# Patient Record
Sex: Female | Born: 1960 | Race: Black or African American | Hispanic: No | Marital: Single | Smoking: Never smoker
Health system: Southern US, Community
[De-identification: ages and names within clinical notes are randomized; demographics above are authoritative.]

---

## 1998-06-29 ENCOUNTER — Ambulatory Visit (HOSPITAL_COMMUNITY): Admission: RE | Admit: 1998-06-29 | Discharge: 1998-06-29 | Payer: Self-pay | Admitting: General Surgery

## 1999-07-06 ENCOUNTER — Ambulatory Visit (HOSPITAL_COMMUNITY): Admission: RE | Admit: 1999-07-06 | Discharge: 1999-07-06 | Payer: Self-pay | Admitting: General Surgery

## 1999-07-06 ENCOUNTER — Encounter (HOSPITAL_BASED_OUTPATIENT_CLINIC_OR_DEPARTMENT_OTHER): Payer: Self-pay | Admitting: General Surgery

## 2000-05-31 ENCOUNTER — Other Ambulatory Visit: Admission: RE | Admit: 2000-05-31 | Discharge: 2000-05-31 | Payer: Self-pay | Admitting: Obstetrics and Gynecology

## 2000-05-31 ENCOUNTER — Encounter (INDEPENDENT_AMBULATORY_CARE_PROVIDER_SITE_OTHER): Payer: Self-pay

## 2000-06-28 ENCOUNTER — Ambulatory Visit (HOSPITAL_COMMUNITY): Admission: RE | Admit: 2000-06-28 | Discharge: 2000-06-28 | Payer: Self-pay | Admitting: Obstetrics and Gynecology

## 2000-06-28 ENCOUNTER — Encounter (INDEPENDENT_AMBULATORY_CARE_PROVIDER_SITE_OTHER): Payer: Self-pay

## 2000-09-17 ENCOUNTER — Encounter (HOSPITAL_BASED_OUTPATIENT_CLINIC_OR_DEPARTMENT_OTHER): Payer: Self-pay | Admitting: General Surgery

## 2000-09-17 ENCOUNTER — Ambulatory Visit (HOSPITAL_COMMUNITY): Admission: RE | Admit: 2000-09-17 | Discharge: 2000-09-17 | Payer: Self-pay | Admitting: General Surgery

## 2001-01-11 ENCOUNTER — Inpatient Hospital Stay (HOSPITAL_COMMUNITY): Admission: AD | Admit: 2001-01-11 | Discharge: 2001-01-11 | Payer: Self-pay | Admitting: Obstetrics and Gynecology

## 2001-04-17 ENCOUNTER — Other Ambulatory Visit: Admission: RE | Admit: 2001-04-17 | Discharge: 2001-04-17 | Payer: Self-pay | Admitting: Obstetrics and Gynecology

## 2001-10-14 ENCOUNTER — Other Ambulatory Visit: Admission: RE | Admit: 2001-10-14 | Discharge: 2001-10-14 | Payer: Self-pay | Admitting: Obstetrics and Gynecology

## 2001-10-15 ENCOUNTER — Ambulatory Visit (HOSPITAL_COMMUNITY): Admission: RE | Admit: 2001-10-15 | Discharge: 2001-10-15 | Payer: Self-pay | Admitting: Gastroenterology

## 2002-01-06 ENCOUNTER — Other Ambulatory Visit: Admission: RE | Admit: 2002-01-06 | Discharge: 2002-01-06 | Payer: Self-pay | Admitting: Obstetrics and Gynecology

## 2002-02-18 ENCOUNTER — Encounter (INDEPENDENT_AMBULATORY_CARE_PROVIDER_SITE_OTHER): Payer: Self-pay | Admitting: *Deleted

## 2002-02-18 ENCOUNTER — Ambulatory Visit (HOSPITAL_COMMUNITY): Admission: RE | Admit: 2002-02-18 | Discharge: 2002-02-18 | Payer: Self-pay | Admitting: Obstetrics and Gynecology

## 2002-07-01 ENCOUNTER — Encounter (HOSPITAL_BASED_OUTPATIENT_CLINIC_OR_DEPARTMENT_OTHER): Payer: Self-pay | Admitting: General Surgery

## 2002-07-01 ENCOUNTER — Ambulatory Visit (HOSPITAL_COMMUNITY): Admission: RE | Admit: 2002-07-01 | Discharge: 2002-07-01 | Payer: Self-pay | Admitting: General Surgery

## 2002-10-06 ENCOUNTER — Other Ambulatory Visit: Admission: RE | Admit: 2002-10-06 | Discharge: 2002-10-06 | Payer: Self-pay | Admitting: Obstetrics and Gynecology

## 2002-11-16 ENCOUNTER — Ambulatory Visit (HOSPITAL_COMMUNITY): Admission: RE | Admit: 2002-11-16 | Discharge: 2002-11-16 | Payer: Self-pay | Admitting: Obstetrics and Gynecology

## 2003-02-10 ENCOUNTER — Other Ambulatory Visit: Admission: RE | Admit: 2003-02-10 | Discharge: 2003-02-10 | Payer: Self-pay | Admitting: Obstetrics and Gynecology

## 2003-11-03 ENCOUNTER — Ambulatory Visit (HOSPITAL_COMMUNITY): Admission: RE | Admit: 2003-11-03 | Discharge: 2003-11-03 | Payer: Self-pay | Admitting: General Surgery

## 2003-11-18 ENCOUNTER — Other Ambulatory Visit: Admission: RE | Admit: 2003-11-18 | Discharge: 2003-11-18 | Payer: Self-pay | Admitting: Obstetrics and Gynecology

## 2003-12-21 ENCOUNTER — Other Ambulatory Visit: Admission: RE | Admit: 2003-12-21 | Discharge: 2003-12-21 | Payer: Self-pay | Admitting: Obstetrics and Gynecology

## 2005-01-24 ENCOUNTER — Ambulatory Visit (HOSPITAL_COMMUNITY): Admission: RE | Admit: 2005-01-24 | Discharge: 2005-01-24 | Payer: Self-pay | Admitting: General Surgery

## 2005-05-28 ENCOUNTER — Encounter: Admission: RE | Admit: 2005-05-28 | Discharge: 2005-08-26 | Payer: Self-pay | Admitting: Internal Medicine

## 2006-01-29 ENCOUNTER — Ambulatory Visit (HOSPITAL_COMMUNITY): Admission: RE | Admit: 2006-01-29 | Discharge: 2006-01-29 | Payer: Self-pay | Admitting: General Surgery

## 2006-02-26 ENCOUNTER — Emergency Department (HOSPITAL_COMMUNITY): Admission: EM | Admit: 2006-02-26 | Discharge: 2006-02-26 | Payer: Self-pay | Admitting: Family Medicine

## 2006-02-28 ENCOUNTER — Emergency Department (HOSPITAL_COMMUNITY): Admission: EM | Admit: 2006-02-28 | Discharge: 2006-02-28 | Payer: Self-pay | Admitting: Family Medicine

## 2006-03-02 ENCOUNTER — Emergency Department (HOSPITAL_COMMUNITY): Admission: EM | Admit: 2006-03-02 | Discharge: 2006-03-02 | Payer: Self-pay | Admitting: Family Medicine

## 2006-07-16 ENCOUNTER — Emergency Department (HOSPITAL_COMMUNITY): Admission: EM | Admit: 2006-07-16 | Discharge: 2006-07-16 | Payer: Self-pay | Admitting: Family Medicine

## 2006-07-16 ENCOUNTER — Encounter (INDEPENDENT_AMBULATORY_CARE_PROVIDER_SITE_OTHER): Payer: Self-pay | Admitting: *Deleted

## 2006-07-16 ENCOUNTER — Ambulatory Visit (HOSPITAL_COMMUNITY): Admission: RE | Admit: 2006-07-16 | Discharge: 2006-07-16 | Payer: Self-pay | Admitting: Family Medicine

## 2006-08-06 ENCOUNTER — Encounter: Admission: RE | Admit: 2006-08-06 | Discharge: 2006-08-06 | Payer: Self-pay | Admitting: Sports Medicine

## 2007-02-04 ENCOUNTER — Ambulatory Visit (HOSPITAL_COMMUNITY): Admission: RE | Admit: 2007-02-04 | Discharge: 2007-02-04 | Payer: Self-pay | Admitting: General Surgery

## 2007-09-05 ENCOUNTER — Ambulatory Visit: Payer: Self-pay | Admitting: Hematology and Oncology

## 2007-09-09 LAB — CBC & DIFF AND RETIC
BASO%: 0.3 % (ref 0.0–2.0)
Basophils Absolute: 0 10*3/uL (ref 0.0–0.1)
EOS%: 3.3 % (ref 0.0–7.0)
Eosinophils Absolute: 0.1 10*3/uL (ref 0.0–0.5)
HCT: 23.4 % — ABNORMAL LOW (ref 34.8–46.6)
HGB: 7.5 g/dL — ABNORMAL LOW (ref 11.6–15.9)
IRF: 0.44 — ABNORMAL HIGH (ref 0.130–0.330)
LYMPH%: 32.3 % (ref 14.0–48.0)
MCH: 22.9 pg — ABNORMAL LOW (ref 26.0–34.0)
MCHC: 32 g/dL (ref 32.0–36.0)
MCV: 71.6 fL — ABNORMAL LOW (ref 81.0–101.0)
MONO#: 0.3 10*3/uL (ref 0.1–0.9)
MONO%: 7.7 % (ref 0.0–13.0)
NEUT#: 2.4 10*3/uL (ref 1.5–6.5)
NEUT%: 56.4 % (ref 39.6–76.8)
Platelets: 318 10*3/uL (ref 145–400)
RBC: 3.26 10*6/uL — ABNORMAL LOW (ref 3.70–5.32)
RDW: 22.5 % — ABNORMAL HIGH (ref 11.3–14.5)
RETIC #: 67.8 10*3/uL (ref 19.7–115.1)
Retic %: 2.1 % (ref 0.4–2.3)
WBC: 4.3 10*3/uL (ref 3.9–10.0)
lymph#: 1.4 10*3/uL (ref 0.9–3.3)

## 2007-09-09 LAB — URINALYSIS, MICROSCOPIC - CHCC
Bilirubin (Urine): NEGATIVE
Glucose: NEGATIVE g/dL
Ketones: NEGATIVE mg/dL
Leukocyte Esterase: NEGATIVE
Nitrite: NEGATIVE
Protein: 30 mg/dL
Specific Gravity, Urine: 1.025 (ref 1.003–1.035)
pH: 6 (ref 4.6–8.0)

## 2007-09-09 LAB — CHCC SMEAR

## 2007-09-18 LAB — IRON AND TIBC
%SAT: 3 % — ABNORMAL LOW (ref 20–55)
Iron: 11 ug/dL — ABNORMAL LOW (ref 42–145)
TIBC: 402 ug/dL (ref 250–470)
UIBC: 391 ug/dL

## 2007-09-18 LAB — APTT: aPTT: 29 seconds (ref 24–37)

## 2007-09-18 LAB — COMPREHENSIVE METABOLIC PANEL
ALT: 10 U/L (ref 0–35)
AST: 13 U/L (ref 0–37)
Albumin: 3.8 g/dL (ref 3.5–5.2)
Alkaline Phosphatase: 64 U/L (ref 39–117)
BUN: 14 mg/dL (ref 6–23)
CO2: 23 mEq/L (ref 19–32)
Calcium: 8.6 mg/dL (ref 8.4–10.5)
Chloride: 107 mEq/L (ref 96–112)
Creatinine, Ser: 0.84 mg/dL (ref 0.40–1.20)
Glucose, Bld: 101 mg/dL — ABNORMAL HIGH (ref 70–99)
Potassium: 4.1 mEq/L (ref 3.5–5.3)
Sodium: 138 mEq/L (ref 135–145)
Total Bilirubin: 0.3 mg/dL (ref 0.3–1.2)
Total Protein: 7 g/dL (ref 6.0–8.3)

## 2007-09-18 LAB — HEMOGLOBINOPATHY EVALUATION
Hemoglobin Other: 0 % (ref 0.0–0.0)
Hgb A2 Quant: 2.2 % (ref 2.2–3.2)
Hgb A: 97.8 % (ref 96.8–97.8)
Hgb F Quant: 0 % (ref 0.0–2.0)
Hgb S Quant: 0 % (ref 0.0–0.0)

## 2007-09-18 LAB — PROTEIN ELECTROPHORESIS, SERUM
Albumin ELP: 51.2 % — ABNORMAL LOW (ref 55.8–66.1)
Alpha-1-Globulin: 4.1 % (ref 2.9–4.9)
Alpha-2-Globulin: 9 % (ref 7.1–11.8)
Beta 2: 6.1 % (ref 3.2–6.5)
Beta Globulin: 7.8 % — ABNORMAL HIGH (ref 4.7–7.2)
Gamma Globulin: 21.8 % — ABNORMAL HIGH (ref 11.1–18.8)
Total Protein, Serum Electrophoresis: 7 g/dL (ref 6.0–8.3)

## 2007-09-18 LAB — DIRECT ANTIGLOBULIN TEST (NOT AT ARMC)
DAT (Complement): NEGATIVE
DAT IgG: NEGATIVE

## 2007-09-18 LAB — HAPTOGLOBIN: Haptoglobin: 140 mg/dL (ref 16–200)

## 2007-09-18 LAB — VON WILLEBRAND PANEL
Factor-VIII Activity: 229 % — ABNORMAL HIGH (ref 75–150)
Ristocetin-Cofactor: >150 % — ABNORMAL HIGH (ref 50–150)
Von Willebrand Ag: 250 % normal — ABNORMAL HIGH (ref 61–164)

## 2007-09-18 LAB — ERYTHROPOIETIN: Erythropoietin: 262 m[IU]/mL — ABNORMAL HIGH (ref 2.6–34.0)

## 2007-09-18 LAB — PROTHROMBIN TIME
INR: 1 (ref 0.0–1.5)
Prothrombin Time: 13.7 seconds (ref 11.6–15.2)

## 2007-09-18 LAB — VITAMIN B12: Vitamin B-12: 424 pg/mL (ref 211–911)

## 2007-09-18 LAB — FERRITIN: Ferritin: 4 ng/mL — ABNORMAL LOW (ref 10–291)

## 2007-10-09 LAB — IRON AND TIBC
%SAT: 20 % (ref 20–55)
Iron: 64 ug/dL (ref 42–145)
TIBC: 317 ug/dL (ref 250–470)
UIBC: 253 ug/dL

## 2007-10-09 LAB — CBC WITH DIFFERENTIAL/PLATELET
BASO%: 0.9 % (ref 0.0–2.0)
Basophils Absolute: 0 10*3/uL (ref 0.0–0.1)
EOS%: 3.5 % (ref 0.0–7.0)
Eosinophils Absolute: 0.2 10*3/uL (ref 0.0–0.5)
HCT: 33.1 % — ABNORMAL LOW (ref 34.8–46.6)
HGB: 11 g/dL — ABNORMAL LOW (ref 11.6–15.9)
LYMPH%: 22.4 % (ref 14.0–48.0)
MCH: 28 pg (ref 26.0–34.0)
MCHC: 33.2 g/dL (ref 32.0–36.0)
MCV: 84.4 fL (ref 81.0–101.0)
MONO#: 0.2 10*3/uL (ref 0.1–0.9)
MONO%: 4.4 % (ref 0.0–13.0)
NEUT#: 3.4 10*3/uL (ref 1.5–6.5)
NEUT%: 68.8 % (ref 39.6–76.8)
Platelets: 406 10*3/uL — ABNORMAL HIGH (ref 145–400)
RBC: 3.93 10*6/uL (ref 3.70–5.32)
RDW: 29.7 % — ABNORMAL HIGH (ref 11.3–14.5)
WBC: 5 10*3/uL (ref 3.9–10.0)
lymph#: 1.1 10*3/uL (ref 0.9–3.3)

## 2007-10-09 LAB — FERRITIN: Ferritin: 326 ng/mL — ABNORMAL HIGH (ref 10–291)

## 2007-10-22 ENCOUNTER — Encounter: Admission: RE | Admit: 2007-10-22 | Discharge: 2007-10-22 | Payer: Self-pay | Admitting: Obstetrics and Gynecology

## 2007-10-28 ENCOUNTER — Encounter: Admission: RE | Admit: 2007-10-28 | Discharge: 2007-10-28 | Payer: Self-pay | Admitting: Interventional Radiology

## 2007-12-23 ENCOUNTER — Ambulatory Visit: Payer: Self-pay | Admitting: Hematology and Oncology

## 2007-12-25 LAB — IRON AND TIBC
%SAT: 10 % — ABNORMAL LOW (ref 20–55)
Iron: 37 ug/dL — ABNORMAL LOW (ref 42–145)
TIBC: 372 ug/dL (ref 250–470)
UIBC: 335 ug/dL

## 2007-12-25 LAB — CBC WITH DIFFERENTIAL/PLATELET
BASO%: 0.2 % (ref 0.0–2.0)
Basophils Absolute: 0 10*3/uL (ref 0.0–0.1)
EOS%: 0.9 % (ref 0.0–7.0)
Eosinophils Absolute: 0.1 10*3/uL (ref 0.0–0.5)
HCT: 36 % (ref 34.8–46.6)
HGB: 12.1 g/dL (ref 11.6–15.9)
LYMPH%: 17.2 % (ref 14.0–48.0)
MCH: 29.5 pg (ref 26.0–34.0)
MCHC: 33.7 g/dL (ref 32.0–36.0)
MCV: 87.8 fL (ref 81.0–101.0)
MONO#: 0.4 10*3/uL (ref 0.1–0.9)
MONO%: 6.5 % (ref 0.0–13.0)
NEUT#: 4.9 10*3/uL (ref 1.5–6.5)
NEUT%: 75.2 % (ref 39.6–76.8)
Platelets: 369 10*3/uL (ref 145–400)
RBC: 4.1 10*6/uL (ref 3.70–5.32)
RDW: 15.8 % — ABNORMAL HIGH (ref 11.3–14.5)
WBC: 6.5 10*3/uL (ref 3.9–10.0)
lymph#: 1.1 10*3/uL (ref 0.9–3.3)

## 2007-12-25 LAB — FERRITIN: Ferritin: 52 ng/mL (ref 10–291)

## 2007-12-25 LAB — BASIC METABOLIC PANEL
BUN: 13 mg/dL (ref 6–23)
CO2: 22 mEq/L (ref 19–32)
Calcium: 9.5 mg/dL (ref 8.4–10.5)
Chloride: 105 mEq/L (ref 96–112)
Creatinine, Ser: 0.73 mg/dL (ref 0.40–1.20)
Glucose, Bld: 98 mg/dL (ref 70–99)
Potassium: 4.2 mEq/L (ref 3.5–5.3)
Sodium: 137 mEq/L (ref 135–145)

## 2008-01-01 ENCOUNTER — Ambulatory Visit (HOSPITAL_COMMUNITY): Admission: RE | Admit: 2008-01-01 | Discharge: 2008-01-02 | Payer: Self-pay | Admitting: Interventional Radiology

## 2008-01-22 ENCOUNTER — Encounter: Admission: RE | Admit: 2008-01-22 | Discharge: 2008-01-22 | Payer: Self-pay | Admitting: Interventional Radiology

## 2008-02-04 LAB — CBC WITH DIFFERENTIAL/PLATELET
BASO%: 0.3 % (ref 0.0–2.0)
Basophils Absolute: 0 10*3/uL (ref 0.0–0.1)
EOS%: 4.5 % (ref 0.0–7.0)
Eosinophils Absolute: 0.2 10*3/uL (ref 0.0–0.5)
HCT: 35.3 % (ref 34.8–46.6)
HGB: 12.1 g/dL (ref 11.6–15.9)
LYMPH%: 29.3 % (ref 14.0–48.0)
MCH: 29.5 pg (ref 26.0–34.0)
MCHC: 34.2 g/dL (ref 32.0–36.0)
MCV: 86.3 fL (ref 81.0–101.0)
MONO#: 0.3 10*3/uL (ref 0.1–0.9)
MONO%: 6.5 % (ref 0.0–13.0)
NEUT#: 2.4 10*3/uL (ref 1.5–6.5)
NEUT%: 59.4 % (ref 39.6–76.8)
Platelets: 283 10*3/uL (ref 145–400)
RBC: 4.09 10*6/uL (ref 3.70–5.32)
RDW: 15.5 % — ABNORMAL HIGH (ref 11.3–14.5)
WBC: 4.1 10*3/uL (ref 3.9–10.0)
lymph#: 1.2 10*3/uL (ref 0.9–3.3)

## 2008-02-04 LAB — BASIC METABOLIC PANEL
BUN: 15 mg/dL (ref 6–23)
CO2: 24 mEq/L (ref 19–32)
Calcium: 9.2 mg/dL (ref 8.4–10.5)
Chloride: 106 mEq/L (ref 96–112)
Creatinine, Ser: 0.76 mg/dL (ref 0.40–1.20)
Glucose, Bld: 87 mg/dL (ref 70–99)
Potassium: 4.4 mEq/L (ref 3.5–5.3)
Sodium: 139 mEq/L (ref 135–145)

## 2008-02-04 LAB — IRON AND TIBC
%SAT: 12 % — ABNORMAL LOW (ref 20–55)
Iron: 39 ug/dL — ABNORMAL LOW (ref 42–145)
TIBC: 330 ug/dL (ref 250–470)
UIBC: 291 ug/dL

## 2008-02-04 LAB — FERRITIN: Ferritin: 57 ng/mL (ref 10–291)

## 2008-02-25 ENCOUNTER — Ambulatory Visit (HOSPITAL_COMMUNITY): Admission: RE | Admit: 2008-02-25 | Discharge: 2008-02-25 | Payer: Self-pay | Admitting: General Surgery

## 2008-04-29 ENCOUNTER — Ambulatory Visit: Payer: Self-pay | Admitting: Hematology and Oncology

## 2008-05-04 LAB — CBC WITH DIFFERENTIAL/PLATELET
BASO%: 0.4 % (ref 0.0–2.0)
Basophils Absolute: 0 10*3/uL (ref 0.0–0.1)
EOS%: 4.7 % (ref 0.0–7.0)
Eosinophils Absolute: 0.2 10*3/uL (ref 0.0–0.5)
HCT: 38.2 % (ref 34.8–46.6)
HGB: 13.1 g/dL (ref 11.6–15.9)
LYMPH%: 36.9 % (ref 14.0–48.0)
MCH: 29.6 pg (ref 26.0–34.0)
MCHC: 34.3 g/dL (ref 32.0–36.0)
MCV: 86.3 fL (ref 81.0–101.0)
MONO#: 0.2 10*3/uL (ref 0.1–0.9)
MONO%: 5.2 % (ref 0.0–13.0)
NEUT#: 2 10*3/uL (ref 1.5–6.5)
NEUT%: 52.8 % (ref 39.6–76.8)
Platelets: 322 10*3/uL (ref 145–400)
RBC: 4.42 10*6/uL (ref 3.70–5.32)
RDW: 15.3 % — ABNORMAL HIGH (ref 11.3–14.5)
WBC: 3.7 10*3/uL — ABNORMAL LOW (ref 3.9–10.0)
lymph#: 1.4 10*3/uL (ref 0.9–3.3)

## 2008-05-04 LAB — BASIC METABOLIC PANEL
BUN: 18 mg/dL (ref 6–23)
CO2: 22 mEq/L (ref 19–32)
Calcium: 9.3 mg/dL (ref 8.4–10.5)
Chloride: 106 mEq/L (ref 96–112)
Creatinine, Ser: 0.84 mg/dL (ref 0.40–1.20)
Glucose, Bld: 108 mg/dL — ABNORMAL HIGH (ref 70–99)
Potassium: 4.4 mEq/L (ref 3.5–5.3)
Sodium: 140 mEq/L (ref 135–145)

## 2008-05-04 LAB — IRON AND TIBC
%SAT: 23 % (ref 20–55)
Iron: 76 ug/dL (ref 42–145)
TIBC: 326 ug/dL (ref 250–470)
UIBC: 250 ug/dL

## 2008-05-04 LAB — FERRITIN: Ferritin: 79 ng/mL (ref 10–291)

## 2008-07-28 ENCOUNTER — Encounter: Admission: RE | Admit: 2008-07-28 | Discharge: 2008-07-28 | Payer: Self-pay | Admitting: Interventional Radiology

## 2009-03-29 ENCOUNTER — Ambulatory Visit (HOSPITAL_COMMUNITY): Admission: RE | Admit: 2009-03-29 | Discharge: 2009-03-29 | Payer: Self-pay | Admitting: Obstetrics and Gynecology

## 2009-05-12 ENCOUNTER — Emergency Department (HOSPITAL_COMMUNITY): Admission: EM | Admit: 2009-05-12 | Discharge: 2009-05-12 | Payer: Self-pay | Admitting: Family Medicine

## 2009-07-17 ENCOUNTER — Emergency Department (HOSPITAL_COMMUNITY): Admission: EM | Admit: 2009-07-17 | Discharge: 2009-07-17 | Payer: Self-pay | Admitting: Emergency Medicine

## 2009-09-29 ENCOUNTER — Emergency Department (HOSPITAL_COMMUNITY): Admission: EM | Admit: 2009-09-29 | Discharge: 2009-09-29 | Payer: Self-pay | Admitting: Emergency Medicine

## 2010-03-31 ENCOUNTER — Ambulatory Visit (HOSPITAL_COMMUNITY): Admission: RE | Admit: 2010-03-31 | Discharge: 2010-03-31 | Payer: Self-pay | Admitting: Obstetrics and Gynecology

## 2011-03-08 LAB — URINALYSIS, ROUTINE W REFLEX MICROSCOPIC
Bilirubin Urine: NEGATIVE
Glucose, UA: NEGATIVE mg/dL
Hgb urine dipstick: NEGATIVE
Ketones, ur: NEGATIVE mg/dL
Nitrite: NEGATIVE
Protein, ur: NEGATIVE mg/dL
Specific Gravity, Urine: 1.014 (ref 1.005–1.030)
Urobilinogen, UA: 0.2 mg/dL (ref 0.0–1.0)
pH: 6.5 (ref 5.0–8.0)

## 2011-03-08 LAB — PREGNANCY, URINE: Preg Test, Ur: NEGATIVE

## 2011-03-08 LAB — POCT I-STAT, CHEM 8
BUN: 14 mg/dL (ref 6–23)
Calcium, Ion: 1.22 mmol/L (ref 1.12–1.32)
Chloride: 105 mEq/L (ref 96–112)
Creatinine, Ser: 0.9 mg/dL (ref 0.4–1.2)
Glucose, Bld: 89 mg/dL (ref 70–99)
HCT: 41 % (ref 36.0–46.0)
Hemoglobin: 13.9 g/dL (ref 12.0–15.0)
Potassium: 4.2 mEq/L (ref 3.5–5.1)
Sodium: 140 mEq/L (ref 135–145)
TCO2: 26 mmol/L (ref 0–100)

## 2011-03-10 LAB — POCT RAPID STREP A (OFFICE): Streptococcus, Group A Screen (Direct): NEGATIVE

## 2011-04-13 ENCOUNTER — Other Ambulatory Visit (HOSPITAL_COMMUNITY): Payer: Self-pay | Admitting: Obstetrics and Gynecology

## 2011-04-13 DIAGNOSIS — Z1231 Encounter for screening mammogram for malignant neoplasm of breast: Secondary | ICD-10-CM

## 2011-04-20 NOTE — Procedures (Signed)
[place]  Patient:    Lacey Li, Lacey Li Visit Number: [id number] MRN: [id number]          Service Type: END Location: ENDO Attending Physician:  Charna Elizabeth Dictated by:   Anselmo Rod, M.D. Proc. Date: 10/15/01 Admit Date:  10/15/2001   CC:         Sheronette A. Cherly Hensen, M.D.   Procedure Report  DATE OF BIRTH:  01-Jun-2061  PROCEDURE PERFORMED:  Colonoscopy.  ENDOSCOPIST:  Anselmo Rod, M.D.  INSTRUMENT USED:  Olympus video colonoscope (pediatric).  INDICATIONS FOR PROCEDURE:  A 50 year old African-American female with a family history of colon and breast cancer, rule out colonic polyps.  PREPROCEDURE PREPARATION:  Informed consent was obtained from the patient. The patient was fasted for 8 hours prior to the procedure, and prepped with a bottle of magnesium citrate and a gallon of NULYTELY the night prior to the procedure.  PREPROCEDURE PHYSICAL EXAMINATION:  VITAL SIGNS:  Stable.  NECK:  Supple.  CHEST:  Clear to auscultation.  S1 and S2 regular.  ABDOMEN:  Soft with normal bowel sounds.  DESCRIPTION OF PROCEDURE:  The patient was placed in the left lateral decubitus position, and sedated with 60 mg of Demerol and 6 mg of Versed intravenously.  Once the patient was adequately sedated and maintained on low flow oxygen and continuous cardiac monitoring, the Olympus video colonoscope was advanced from the rectum to the cecum and terminal ileum without difficulty.  There was some residual stool in the colon.  Multiple washings were done.  No masses, polyps, erosions, ulcerations, or diverticuli were seen.  IMPRESSION:  Normal appearing colon.  RECOMMENDATIONS:  Repeat colorectal cancer screening is recommended in the next 5 years unless the patient is to develop any abnormal symptoms in the interim. Dictated by:   Anselmo Rod, M.D. Attending Physician:  Charna Elizabeth DD:  10/15/01 TD:  10/15/01 Job:  [id number]

## 2011-04-20 NOTE — Op Note (Signed)
NAME:  Lacey Li, Lacey Li                            ACCOUNT NO.:  [id number]   MEDICAL RECORD NO.:  [id number]                   PATIENT TYPE:  AMB   LOCATION:  SDC                                  FACILITY:  [facility]   PHYSICIAN:  Maxie Better, M.D.            DATE OF BIRTH:  1961-06-16   DATE OF PROCEDURE:  11/16/2002  DATE OF DISCHARGE:                                 OPERATIVE REPORT   PREOPERATIVE DIAGNOSIS:  Desires sterilization.   POSTOPERATIVE DIAGNOSIS:  Desires sterilization.   PROCEDURE:  Laparoscopic tubal ligation with bipolar cautery.   SURGEON:  Maxie Better, M.D.   ANESTHESIA:  General.   INDICATIONS:  This is a 50 year old para 1, single black female, last  menstrual period approximately one week ago who desires permanent  sterilization.  The patient's history is notable for uterine fibroids,  hysteroscopic resection of submucosal fibroids, and conization for severe  cervical dysplasia.  The risks and benefits of the procedure have been  explained to the patient.  Consent was signed.  The patient was transferred  to the operating room.   DESCRIPTION OF PROCEDURE:  Under adequate general anesthesia, the patient  was placed in the dorsal lithotomy position.  She was sterilely prepped and  draped in the usual fashion.  The bladder was catheterized of a moderate  amount of urine.  A bivalve speculum was placed in the vagina.  A single-  tooth tenaculum was placed in the anterior lip of the cervix.  Acorn cannula  was introduced into the cervical os and attached to the tenaculum for  manipulation of the uterus.  The bivalve speculum was removed.  Attention  was then turned to the abdomen where an infraumbilical incision was then  made.  The Veress needle x 2 was attempted with success on the second  attempt where an opening pressure of 9 was noted.  Two liters of carbon  dioxide was insufflated.  The Veress needle was removed.  A disposable 10 mm  trocar was  introduced into the abdomen without incident.  A lighted video  laparoscope was introduced through the port.  A small suprapubic incision  was then made and under direct visualization a 5 mm port was introduced  under direct visualization.  Using the probe, the pelvis was inspected.  The  uterus was noted to be bulky consistent with a prior history of fibroids.  Both ovaries were normal.  Both tubes were normal.  No evidence of  endometriosis was noted in the posterior cul-de-sac.  The upper abdomen was  notable for normal liver edge, small adhesion in the right anterior  abdominal wall which was not lysed.  The probe was replaced by a bipolar  cautery.  The midportion of both fallopian tubes were cauterized for about a  1.5 cm distance with good cauterization noted.  The procedure was then  terminated.  The suprapubic port was removed.  The site inspected, abdomen  deflated, and the infraumbilical port was then removed taking care not to  bring up any underlying structure.  All instruments were subsequently  removed from the vagina at the end of the case.  The incisions were closed  with 4-0 Vicryl with a subcuticular stitch and at the infraumbilical  incision 0 Vicryl was then used to close the deep layer.  Specimen was none.  Estimated blood loss was minimal.  Complications none.  The patient  tolerated the procedure well and was transferred to the recovery room in  stable condition.                                               Maxie Better, M.D.   [place]/MEDQ  D:  11/16/2002  T:  11/16/2002  Job:  [id number]

## 2011-04-23 ENCOUNTER — Ambulatory Visit (HOSPITAL_COMMUNITY)
Admission: RE | Admit: 2011-04-23 | Discharge: 2011-04-23 | Disposition: A | Discharge status: Home / Self Care | Payer: BC Managed Care – PPO | Source: Ambulatory Visit | Attending: Obstetrics and Gynecology | Admitting: Obstetrics and Gynecology

## 2011-04-23 DIAGNOSIS — Z1231 Encounter for screening mammogram for malignant neoplasm of breast: Secondary | ICD-10-CM

## 2011-04-25 ENCOUNTER — Other Ambulatory Visit: Payer: Self-pay | Admitting: Obstetrics and Gynecology

## 2011-04-25 DIAGNOSIS — N6320 Unspecified lump in the left breast, unspecified quadrant: Secondary | ICD-10-CM

## 2011-05-01 ENCOUNTER — Ambulatory Visit
Admission: RE | Admit: 2011-05-01 | Discharge: 2011-05-01 | Disposition: A | Discharge status: Home / Self Care | Payer: BC Managed Care – PPO | Source: Ambulatory Visit | Attending: Obstetrics and Gynecology | Admitting: Obstetrics and Gynecology

## 2011-05-01 DIAGNOSIS — N6320 Unspecified lump in the left breast, unspecified quadrant: Secondary | ICD-10-CM

## 2011-05-24 ENCOUNTER — Inpatient Hospital Stay (INDEPENDENT_AMBULATORY_CARE_PROVIDER_SITE_OTHER)
Admission: RE | Admit: 2011-05-24 | Discharge: 2011-05-24 | Disposition: A | Discharge status: Home / Self Care | Payer: BC Managed Care – PPO | Source: Ambulatory Visit | Attending: Emergency Medicine | Admitting: Emergency Medicine

## 2011-05-24 DIAGNOSIS — J019 Acute sinusitis, unspecified: Secondary | ICD-10-CM

## 2011-06-11 ENCOUNTER — Other Ambulatory Visit: Payer: Self-pay | Admitting: Obstetrics and Gynecology

## 2011-08-23 LAB — CBC
HCT: 33.2 — ABNORMAL LOW
Hemoglobin: 11.2 — ABNORMAL LOW
MCHC: 33.8
MCV: 88.9
Platelets: 326
RBC: 3.73 — ABNORMAL LOW
RDW: 15.5
WBC: 4.4

## 2011-08-23 LAB — CREATININE, SERUM
Creatinine, Ser: 0.6
GFR calc Af Amer: >60
GFR calc non Af Amer: >60

## 2011-08-23 LAB — HCG, SERUM, QUALITATIVE: Preg, Serum: NEGATIVE

## 2012-04-03 ENCOUNTER — Other Ambulatory Visit (HOSPITAL_COMMUNITY): Payer: Self-pay | Admitting: Obstetrics and Gynecology

## 2012-04-03 DIAGNOSIS — Z1231 Encounter for screening mammogram for malignant neoplasm of breast: Secondary | ICD-10-CM

## 2012-05-01 ENCOUNTER — Ambulatory Visit (HOSPITAL_COMMUNITY)
Admission: RE | Admit: 2012-05-01 | Discharge: 2012-05-01 | Disposition: A | Discharge status: Home / Self Care | Payer: BC Managed Care – PPO | Source: Ambulatory Visit | Attending: Obstetrics and Gynecology | Admitting: Obstetrics and Gynecology

## 2012-05-01 DIAGNOSIS — Z1231 Encounter for screening mammogram for malignant neoplasm of breast: Secondary | ICD-10-CM

## 2013-04-06 ENCOUNTER — Other Ambulatory Visit (HOSPITAL_COMMUNITY): Payer: Self-pay | Admitting: Obstetrics and Gynecology

## 2013-04-06 DIAGNOSIS — Z1231 Encounter for screening mammogram for malignant neoplasm of breast: Secondary | ICD-10-CM

## 2013-05-05 ENCOUNTER — Ambulatory Visit (HOSPITAL_COMMUNITY)
Admission: RE | Admit: 2013-05-05 | Discharge: 2013-05-05 | Disposition: A | Discharge status: Home / Self Care | Payer: BC Managed Care – PPO | Source: Ambulatory Visit | Attending: Obstetrics and Gynecology | Admitting: Obstetrics and Gynecology

## 2013-05-05 DIAGNOSIS — Z1231 Encounter for screening mammogram for malignant neoplasm of breast: Secondary | ICD-10-CM

## 2013-09-17 ENCOUNTER — Encounter (HOSPITAL_COMMUNITY): Payer: Self-pay | Admitting: Emergency Medicine

## 2013-09-17 ENCOUNTER — Emergency Department (HOSPITAL_COMMUNITY)
Admission: EM | Admit: 2013-09-17 | Discharge: 2013-09-18 | Disposition: A | Discharge status: Home / Self Care | Payer: BC Managed Care – PPO | Attending: Emergency Medicine | Admitting: Emergency Medicine

## 2013-09-17 DIAGNOSIS — Y929 Unspecified place or not applicable: Secondary | ICD-10-CM | POA: Insufficient documentation

## 2013-09-17 DIAGNOSIS — Y9389 Activity, other specified: Secondary | ICD-10-CM | POA: Insufficient documentation

## 2013-09-17 DIAGNOSIS — F17200 Nicotine dependence, unspecified, uncomplicated: Secondary | ICD-10-CM | POA: Insufficient documentation

## 2013-09-17 DIAGNOSIS — T63461A Toxic effect of venom of wasps, accidental (unintentional), initial encounter: Secondary | ICD-10-CM | POA: Insufficient documentation

## 2013-09-17 DIAGNOSIS — T6391XA Toxic effect of contact with unspecified venomous animal, accidental (unintentional), initial encounter: Secondary | ICD-10-CM | POA: Insufficient documentation

## 2013-09-17 MED ORDER — DIPHENHYDRAMINE HCL 50 MG/ML IJ SOLN
25.0000 mg | Freq: Once | INTRAMUSCULAR | Status: AC
  Administered 2013-09-18: 25 mg via INTRAVENOUS
  Filled 2013-09-17: qty 1

## 2013-09-17 MED ORDER — DEXAMETHASONE SODIUM PHOSPHATE 10 MG/ML IJ SOLN
10.0000 mg | Freq: Once | INTRAMUSCULAR | Status: AC
  Administered 2013-09-18: 10 mg via INTRAMUSCULAR
  Filled 2013-09-17: qty 1

## 2013-09-17 NOTE — ED Notes (Signed)
Bee sting to lt. Ant. Hand. Moderate swelling from wrist to mid forearm.

## 2013-09-18 NOTE — ED Provider Notes (Signed)
CSN: [id number]     Arrival date & time 09/17/13  2244 History   First MD Initiated Contact with Patient 09/17/13 2331     Chief Complaint  Patient presents with  . Insect Bite   (Consider location/radiation/quality/duration/timing/severity/associated sxs/prior Treatment) The history is provided by the patient and medical records.   Patient presenting to the ED following bee sting to her left hand yesterday. States she initially came to the ER but wait was too long so she did not stay. She complains of persistent swelling and warmth to touch of her left hand and distal forearm.  Patient denies any numbness or paresthesias of left upper extremity.  No known allergy to bees.  Patient has taken Benadryl without significant relief.  Denies any chest pain, SOB, sensation of throat closing, or palpitations.  History reviewed. No pertinent past medical history. History reviewed. No pertinent past surgical history. History reviewed. No pertinent family history. History  Substance Use Topics  . Smoking status: Current Every Day Smoker  . Smokeless tobacco: Not on file  . Alcohol Use: No   OB History   Grav Para Term Preterm Abortions TAB SAB Ect Mult Living                 Review of Systems  Skin:       Bee sting  All other systems reviewed and are negative.    Allergies  Review of patient's allergies indicates not on file.  Home Medications  No current outpatient prescriptions on file. BP 104/61  Pulse 78  Temp(Src) 97.8 F (36.6 C) (Oral)  Resp 18  Ht 5' 6.5" (1.689 m)  Wt 227 lb (102.967 kg)  BMI 36.09 kg/m2  SpO2 97%  Physical Exam  Nursing note and vitals reviewed. Constitutional: She is oriented to person, place, and time. She appears well-developed and well-nourished.  HENT:  Head: Normocephalic and atraumatic.  Mouth/Throat: Uvula is midline, oropharynx is clear and moist and mucous membranes are normal. No oropharyngeal exudate, posterior oropharyngeal edema,  posterior oropharyngeal erythema or tonsillar abscesses.  No oropharyngeal edema; airway patent; speaking in full complete sentences without difficulty  Eyes: Conjunctivae and EOM are normal. Pupils are equal, round, and reactive to light.  Neck: Normal range of motion.  Cardiovascular: Normal rate, regular rhythm and normal heart sounds.   Pulmonary/Chest: Effort normal and breath sounds normal.  Musculoskeletal: Normal range of motion.       Right hand: She exhibits swelling. She exhibits normal range of motion, no tenderness, no bony tenderness, normal capillary refill, no deformity and no laceration. Normal sensation noted. Normal strength noted.       Hands: Bee sting to left hand with small welt present; erythema, swelling, and warmth to touch extending from MCP joints of hand to distal forearm; full ROM maintained; strong radial pulse and cap refill; sensation intact; no drainage or signs of infection  Neurological: She is alert and oriented to person, place, and time.  Skin: Skin is warm and dry.  Psychiatric: She has a normal mood and affect.    ED Course  Procedures (including critical care time) Labs Review Labs Reviewed - No data to display Imaging Review No results found.  EKG Interpretation   None       MDM   1. Bee sting reaction, initial encounter    Localized bee reaction without signs of infection or anaphylaxis.  Benadryl and decadron given in the ED.  Instructed to continue taking benadryl at home.  FU  with PCP if problems occur.  Discussed plan with pt, they agreed.  Return precautions advised.  Garlon Hatchet, PA-C 09/18/13 [phone]

## 2013-10-04 NOTE — ED Provider Notes (Signed)
Medical screening examination/treatment/procedure(s) were performed by non-physician practitioner and as supervising physician I was immediately available for consultation/collaboration.  Aldo Sondgeroth M Lashandra Arauz, MD 10/04/13 1845 

## 2014-04-05 ENCOUNTER — Emergency Department (INDEPENDENT_AMBULATORY_CARE_PROVIDER_SITE_OTHER)
Admission: EM | Admit: 2014-04-05 | Discharge: 2014-04-05 | Disposition: A | Discharge status: Home / Self Care | Payer: BC Managed Care – PPO | Source: Home / Self Care | Attending: Emergency Medicine | Admitting: Emergency Medicine

## 2014-04-05 ENCOUNTER — Encounter (HOSPITAL_COMMUNITY): Payer: Self-pay | Admitting: Emergency Medicine

## 2014-04-05 DIAGNOSIS — J039 Acute tonsillitis, unspecified: Secondary | ICD-10-CM

## 2014-04-05 MED ORDER — AMOXICILLIN 500 MG PO CAPS: 500.0000 mg | ORAL_CAPSULE | Freq: Three times a day (TID) | ORAL | Status: DC

## 2014-04-05 MED ORDER — PREDNISONE 20 MG PO TABS
20.0000 mg | ORAL_TABLET | Freq: Two times a day (BID) | ORAL | Status: DC
Start: 2014-04-05 — End: 2020-12-17

## 2014-04-05 NOTE — ED Provider Notes (Signed)
  Chief Complaint   Chief Complaint  Patient presents with  . Sore Throat    History of Present Illness   Lacey Li is a 53 year old female who has had a three-day history of severe sore throat, pain with swallowing, chills, headache, nasal congestion, rhinorrhea, swollen glands, and watery eyes. She denies any recent strep exposure. She's had no fever, earache, difficulty breathing, coughing, wheezing, or GI symptoms. No prior history of strep.   Review of Systems   Other than as noted above, the patient denies any of the following symptoms. Systemic:  No fever, chills, sweats, myalgias, or headache. Eye:  No redness, pain or drainage. ENT:  No earache, nasal congestion, sneezing, rhinorrhea, sinus pressure, sinus pain, or post nasal drip. Lungs:  No cough, sputum production, wheezing, shortness of breath, or chest pain. GI:  No abdominal pain, nausea, vomiting, or diarrhea. Skin:  No rash.  PMFSH   Past medical history, family history, social history, meds, and allergies were reviewed.   Physical Exam     Vital signs:  BP 104/55  Pulse 78  Temp(Src) 98.2 F (36.8 C) (Oral)  Resp 18  SpO2 100% General:  Alert, in no distress. Phonation was normal, no drooling, and patient was able to handle secretions well.  Eye:  No conjunctival injection or drainage. Lids were normal. ENT:  TMs and canals were normal, without erythema or inflammation.  Nasal mucosa was clear and uncongested, without drainage.  Mucous membranes were moist.  Exam of pharynx reveals tonsils to be enlarged and red with spots of white exudate.  There were no oral ulcerations or lesions. There was no bulging of the tonsillar pillars, and the uvula was midline. Neck:  Supple, no adenopathy, tenderness or mass. Lungs:  No respiratory distress.  Lungs were clear to auscultation, without wheezes, rales or rhonchi.  Breath sounds were clear and equal bilaterally.  Heart:  Regular rhythm, without gallops, murmers or  rubs. Skin:  Clear, warm, and dry, without rash or lesions.  Assessment   The encounter diagnosis was Tonsillitis.  There is no evidence of a peritonsillar abscess.    Plan     1.  Meds:  The following meds were prescribed:   Discharge Medication List as of 04/05/2014  1:32 PM    START taking these medications   Details  amoxicillin (AMOXIL) 500 MG capsule Take 1 capsule (500 mg total) by mouth 3 (three) times daily., Starting 04/05/2014, Until Discontinued, Normal    predniSONE (DELTASONE) 20 MG tablet Take 1 tablet (20 mg total) by mouth 2 (two) times daily., Starting 04/05/2014, Until Discontinued, Normal        2.  Patient Education/Counseling:  The patient was given appropriate handouts, self care instructions, and instructed in symptomatic relief, including hot saline gargles, throat lozenges, infectious precautions, and need to trade out toothbrush.    3.  Follow up:  The patient was told to follow up here if no better in 3 to 4 days, or sooner if becoming worse in any way, and given some red flag symptoms such as difficulty swallowing or breathing which would prompt immediate return.      Reuben Likesavid C Dontae Minerva, MD 04/05/14 [phone]

## 2014-04-05 NOTE — Discharge Instructions (Signed)
Pharyngitis °Pharyngitis is redness, pain, and swelling (inflammation) of your pharynx.  °CAUSES  °Pharyngitis is usually caused by infection. Most of the time, these infections are from viruses (viral) and are part of a cold. However, sometimes pharyngitis is caused by bacteria (bacterial). Pharyngitis can also be caused by allergies. Viral pharyngitis may be spread from person to person by coughing, sneezing, and personal items or utensils (cups, forks, spoons, toothbrushes). Bacterial pharyngitis may be spread from person to person by more intimate contact, such as kissing.  °SIGNS AND SYMPTOMS  °Symptoms of pharyngitis include:   °· Sore throat.   °· Tiredness (fatigue).   °· Low-grade fever.   °· Headache. °· Joint pain and muscle aches. °· Skin rashes. °· Swollen lymph nodes. °· Plaque-like film on throat or tonsils (often seen with bacterial pharyngitis). °DIAGNOSIS  °Your health care provider will ask you questions about your illness and your symptoms. Your medical history, along with a physical exam, is often all that is needed to diagnose pharyngitis. Sometimes, a rapid strep test is done. Other lab tests may also be done, depending on the suspected cause.  °TREATMENT  °Viral pharyngitis will usually get better in 3 4 days without the use of medicine. Bacterial pharyngitis is treated with medicines that kill germs (antibiotics).  °HOME CARE INSTRUCTIONS  °· Drink enough water and fluids to keep your urine clear or pale yellow.   °· Only take over-the-counter or prescription medicines as directed by your health care provider:   °· If you are prescribed antibiotics, make sure you finish them even if you start to feel better.   °· Do not take aspirin.   °· Get lots of rest.   °· Gargle with 8 oz of salt water (½ tsp of salt per 1 qt of water) as often as every 1 2 hours to soothe your throat.   °· Throat lozenges (if you are not at risk for choking) or sprays may be used to soothe your throat. °SEEK MEDICAL  CARE IF:  °· You have large, tender lumps in your neck. °· You have a rash. °· You cough up green, yellow-brown, or bloody spit. °SEEK IMMEDIATE MEDICAL CARE IF:  °· Your neck becomes stiff. °· You drool or are unable to swallow liquids. °· You vomit or are unable to keep medicines or liquids down. °· You have severe pain that does not go away with the use of recommended medicines. °· You have trouble breathing (not caused by a stuffy nose). °MAKE SURE YOU:  °· Understand these instructions. °· Will watch your condition. °· Will get help right away if you are not doing well or get worse. °Document Released: 11/19/2005 Document Revised: 09/09/2013 Document Reviewed: 07/27/2013 °ExitCare® Patient Information ©2014 [employer]. ° °

## 2014-04-05 NOTE — ED Notes (Signed)
C/o  Sore throat since Saturday.  Pain with swallowing.  Neck soreness.  Post nasal drip.   Mild relief with salt water gargles.

## 2014-04-06 LAB — CULTURE, GROUP A STREP

## 2014-04-06 NOTE — ED Notes (Signed)
Throat culture: Strep beta hemolytic not group A.  Pt. adequately treated with Amoxicillin.  Needs notified. Lacey Li 04/06/2014

## 2014-04-06 NOTE — Progress Notes (Signed)
Quick Note:  Results are abnormal as noted, but have been adequately treated. No further action necessary. ______ 

## 2014-04-08 ENCOUNTER — Telehealth (HOSPITAL_COMMUNITY): Payer: Self-pay | Admitting: *Deleted

## 2014-04-08 NOTE — ED Notes (Signed)
I called pt. Pt. verified x 2 and given result.  Pt. told she was adequately treated with Amoxicillin.  If anyone she exposed gets the same symptoms, should get checked for strep and if not better after the medication to get rechecked.  Lacey Li 04/08/2014

## 2014-04-23 ENCOUNTER — Emergency Department (INDEPENDENT_AMBULATORY_CARE_PROVIDER_SITE_OTHER)
Admission: EM | Admit: 2014-04-23 | Discharge: 2014-04-23 | Disposition: A | Discharge status: Home / Self Care | Payer: BC Managed Care – PPO | Source: Home / Self Care

## 2014-04-23 ENCOUNTER — Encounter (HOSPITAL_COMMUNITY): Payer: Self-pay | Admitting: Emergency Medicine

## 2014-04-23 DIAGNOSIS — R0982 Postnasal drip: Secondary | ICD-10-CM

## 2014-04-23 DIAGNOSIS — J029 Acute pharyngitis, unspecified: Secondary | ICD-10-CM

## 2014-04-23 DIAGNOSIS — J309 Allergic rhinitis, unspecified: Secondary | ICD-10-CM

## 2014-04-23 LAB — POCT RAPID STREP A: Streptococcus, Group A Screen (Direct): NEGATIVE

## 2014-04-23 NOTE — ED Notes (Signed)
Pt c/o sore throat onset 3 days Reports she was seen here on 04/05/14; treated for strep Took all antibiotics; tolerated well Sx today include dry cough, odynophagia Denies f/v/n/d Alert w/no signs of acute distress.

## 2014-04-23 NOTE — Discharge Instructions (Signed)
Allergic Rhinitis Allegra 180 mg a day May supplement with antihistamine Chlor-Trimeton 2-4 mg every 6 hours. Flonase nasal spray Lots of saline nasal spray frequently Lots of fluids Allergic rhinitis is when the mucous membranes in the nose respond to allergens. Allergens are particles in the air that cause your body to have an allergic reaction. This causes you to release allergic antibodies. Through a chain of events, these eventually cause you to release histamine into the blood stream. Although meant to protect the body, it is this release of histamine that causes your discomfort, such as frequent sneezing, congestion, and an itchy, runny nose.  CAUSES  Seasonal allergic rhinitis (hay fever) is caused by pollen allergens that may come from grasses, trees, and weeds. Year-round allergic rhinitis (perennial allergic rhinitis) is caused by allergens such as house dust mites, pet dander, and mold spores.  SYMPTOMS   Nasal stuffiness (congestion).  Itchy, runny nose with sneezing and tearing of the eyes. DIAGNOSIS  Your health care provider can help you determine the allergen or allergens that trigger your symptoms. If you and your health care provider are unable to determine the allergen, skin or blood testing may be used. TREATMENT  Allergic Rhinitis does not have a cure, but it can be controlled by:  Medicines and allergy shots (immunotherapy).  Avoiding the allergen. Hay fever may often be treated with antihistamines in pill or nasal spray forms. Antihistamines block the effects of histamine. There are over-the-counter medicines that may help with nasal congestion and swelling around the eyes. Check with your health care provider before taking or giving this medicine.  If avoiding the allergen or the medicine prescribed do not work, there are many new medicines your health care provider can prescribe. Stronger medicine may be used if initial measures are ineffective. Desensitizing  injections can be used if medicine and avoidance does not work. Desensitization is when a patient is given ongoing shots until the body becomes less sensitive to the allergen. Make sure you follow up with your health care provider if problems continue. HOME CARE INSTRUCTIONS It is not possible to completely avoid allergens, but you can reduce your symptoms by taking steps to limit your exposure to them. It helps to know exactly what you are allergic to so that you can avoid your specific triggers. SEEK MEDICAL CARE IF:   You have a fever.  You develop a cough that does not stop easily (persistent).  You have shortness of breath.  You start wheezing.  Symptoms interfere with normal daily activities. Document Released: 08/14/2001 Document Revised: 09/09/2013 Document Reviewed: 07/27/2013 [facility] InExitCare Patient Information 2014 [place], [place].  Sore Throat A sore throat is pain, burning, irritation, or scratchiness of the throat. There is often pain or tenderness when swallowing or talking. A sore throat may be accompanied by other symptoms, such as coughing, sneezing, fever, and swollen neck glands. A sore throat is often the first sign of another sickness, such as a cold, flu, strep throat, or mononucleosis (commonly known as mono). Most sore throats go away without medical treatment. CAUSES  The most common causes of a sore throat include:  A viral infection, such as a cold, flu, or mono.  A bacterial infection, such as strep throat, tonsillitis, or whooping cough.  Seasonal allergies.  Dryness in the air.  Irritants, such as smoke or pollution.  Gastroesophageal reflux disease (GERD). HOME CARE INSTRUCTIONS   Only take over-the-counter medicines as directed by your caregiver.  Drink enough fluids to keep your urine  clear or pale yellow.  Rest as needed.  Try using throat sprays, lozenges, or sucking on hard candy to ease any pain (if older than 4 years or as directed).  Sip  warm liquids, such as broth, herbal tea, or warm water with honey to relieve pain temporarily. You may also eat or drink cold or frozen liquids such as frozen ice pops.  Gargle with salt water (mix 1 tsp salt with 8 oz of water).  Do not smoke and avoid secondhand smoke.  Put a cool-mist humidifier in your bedroom at night to moisten the air. You can also turn on a hot shower and sit in the bathroom with the door closed for 5 10 minutes. SEEK IMMEDIATE MEDICAL CARE IF:  You have difficulty breathing.  You are unable to swallow fluids, soft foods, or your saliva.  You have increased swelling in the throat.  Your sore throat does not get better in 7 days.  You have nausea and vomiting.  You have a fever or persistent symptoms for more than 2 3 days.  You have a fever and your symptoms suddenly get worse. MAKE SURE YOU:   Understand these instructions.  Will watch your condition.  Will get help right away if you are not doing well or get worse. Document Released: 12/27/2004 Document Revised: 11/05/2012 Document Reviewed: 07/27/2012 [facility] Patient Information 2014 [place], [place].  Pharyngitis Pharyngitis is a sore throat (pharynx). There is redness, pain, and swelling of your throat. HOME CARE   Drink enough fluids to keep your pee (urine) clear or pale yellow.  Only take medicine as told by your doctor.  You may get sick again if you do not take medicine as told. Finish your medicines, even if you start to feel better.  Do not take aspirin.  Rest.  Rinse your mouth (gargle) with salt water ( tsp of salt per 1 qt of water) every 1 2 hours. This will help the pain.  If you are not at risk for choking, you can suck on hard candy or sore throat lozenges. GET HELP IF:  You have large, tender lumps on your neck.  You have a rash.  You cough up green, yellow-brown, or bloody spit. GET HELP RIGHT AWAY IF:   You have a stiff neck.  You drool or cannot swallow  liquids.  You throw up (vomit) or are not able to keep medicine or liquids down.  You have very bad pain that does not go away with medicine.  You have problems breathing (not from a stuffy nose). MAKE SURE YOU:   Understand these instructions.  Will watch your condition.  Will get help right away if you are not doing well or get worse. Document Released: 05/07/2008 Document Revised: 09/09/2013 Document Reviewed: 07/27/2013 [facility] Patient Information 2014 [place], [place].

## 2014-04-23 NOTE — ED Provider Notes (Signed)
CSN: [id number]     Arrival date & time 04/23/14  1047 History   First MD Initiated Contact with Patient 04/23/14 1125     Chief Complaint  Patient presents with  . Sore Throat   (Consider location/radiation/quality/duration/timing/severity/associated sxs/prior Treatment) HPI Comments: 53 year old female complaining of a three-day history of sore throat, clearing of the throat frequently, PND, cough particularly upon awakening in the morning. She was seen approximately 3 weeks ago in the urgent care and found to have a positive throat culture for strep Non-A. She was treated with amoxicillin and completed the entire course. She was feeling much better although the throat discomfort did not abate 100%.   History reviewed. No pertinent past medical history. History reviewed. No pertinent past surgical history. No family history on file. History  Substance Use Topics  . Smoking status: Current Every Day Smoker  . Smokeless tobacco: Not on file  . Alcohol Use: No   OB History   Grav Para Term Preterm Abortions TAB SAB Ect Mult Living                 Review of Systems  Constitutional: Negative for fever, chills, activity change, appetite change and fatigue.  HENT: Positive for congestion, postnasal drip, rhinorrhea and sore throat. Negative for facial swelling and sinus pressure.   Eyes: Negative.   Respiratory: Positive for cough. Negative for shortness of breath.   Cardiovascular: Negative.   Gastrointestinal: Negative.   Genitourinary: Negative.   Musculoskeletal: Negative for neck pain and neck stiffness.  Skin: Negative for pallor and rash.  Neurological: Negative.     Allergies  Review of patient's allergies indicates no known allergies.  Home Medications   Prior to Admission medications   Medication Sig Start Date End Date Taking? Authorizing Provider  amoxicillin (AMOXIL) 500 MG capsule Take 1 capsule (500 mg total) by mouth 3 (three) times daily. 04/05/14  Yes Reuben Likes, MD  predniSONE (DELTASONE) 20 MG tablet Take 1 tablet (20 mg total) by mouth 2 (two) times daily. 04/05/14  Yes Reuben Likes, MD   BP 122/61  Pulse 88  Temp(Src) 98.4 F (36.9 C) (Oral)  Resp 20  SpO2 99% Physical Exam  Nursing note and vitals reviewed. Constitutional: She is oriented to person, place, and time. She appears well-developed and well-nourished. No distress.  HENT:  Bilateral TMs are normal Oropharynx with minor erythema, cobblestoning, moderate amount of PND. No exudates. No tonsillar swelling.  Eyes: Conjunctivae and EOM are normal.  Neck: Normal range of motion. Neck supple.  Cardiovascular: Normal rate, regular rhythm and normal heart sounds.   Pulmonary/Chest: Effort normal and breath sounds normal. No respiratory distress. She has no wheezes. She has no rales.  Lymphadenopathy:    She has no cervical adenopathy.  Neurological: She is alert and oriented to person, place, and time. She exhibits normal muscle tone.  Skin: Skin is warm and dry.  Psychiatric: She has a normal mood and affect.    ED Course  Procedures (including critical care time) Labs Review Labs Reviewed  POCT RAPID STREP A ([facility] ONLY)    Imaging Review No results found. Results for orders placed during the hospital encounter of 04/23/14  POCT RAPID STREP A ([facility] ONLY)      Result Value Ref Range   Streptococcus, Group A Screen (Direct) NEGATIVE  NEGATIVE     MDM   1. Allergic rhinitis   2. Allergic pharyngitis   3. PND (post-nasal  drip)     Doubt this is infectious . Culture pending. More at rhinitis/PND. Allegra 180 mg. May add Chlortrimeton 2-4 mg as needed Flonase NS Lots of saline nasal spray Lots of fluids po Cepacol Loz Ibuprofen prn     Hayden Rasmussenavid Kimberlie Csaszar, NP 04/23/14 1202

## 2014-04-24 NOTE — ED Provider Notes (Signed)
Medical screening examination/treatment/procedure(s) were performed by resident physician or non-physician practitioner and as supervising physician I was immediately available for consultation/collaboration.   Cru Kritikos DOUGLAS MD.   Shelbi Vaccaro D Jonee Lamore, MD 04/24/14 1126 

## 2014-04-25 LAB — CULTURE, GROUP A STREP

## 2014-05-31 ENCOUNTER — Other Ambulatory Visit (HOSPITAL_COMMUNITY): Payer: Self-pay | Admitting: Obstetrics and Gynecology

## 2014-05-31 DIAGNOSIS — Z1231 Encounter for screening mammogram for malignant neoplasm of breast: Secondary | ICD-10-CM

## 2014-06-03 ENCOUNTER — Ambulatory Visit (HOSPITAL_COMMUNITY)
Admission: RE | Admit: 2014-06-03 | Discharge: 2014-06-03 | Disposition: A | Discharge status: Home / Self Care | Payer: BC Managed Care – PPO | Source: Ambulatory Visit | Attending: Obstetrics and Gynecology | Admitting: Obstetrics and Gynecology

## 2014-06-03 DIAGNOSIS — Z1231 Encounter for screening mammogram for malignant neoplasm of breast: Secondary | ICD-10-CM

## 2014-09-03 ENCOUNTER — Encounter (HOSPITAL_COMMUNITY): Payer: Self-pay | Admitting: Emergency Medicine

## 2014-09-03 ENCOUNTER — Emergency Department (INDEPENDENT_AMBULATORY_CARE_PROVIDER_SITE_OTHER)
Admission: EM | Admit: 2014-09-03 | Discharge: 2014-09-03 | Disposition: A | Discharge status: Home / Self Care | Payer: BC Managed Care – PPO | Source: Home / Self Care | Attending: Family Medicine | Admitting: Family Medicine

## 2014-09-03 DIAGNOSIS — J020 Streptococcal pharyngitis: Secondary | ICD-10-CM

## 2014-09-03 LAB — POCT RAPID STREP A: Streptococcus, Group A Screen (Direct): POSITIVE — AB

## 2014-09-03 MED ORDER — CEFDINIR 300 MG PO CAPS
300.0000 mg | ORAL_CAPSULE | Freq: Two times a day (BID) | ORAL | Status: DC
Start: 2014-09-03 — End: 2020-12-17

## 2014-09-03 NOTE — Discharge Instructions (Signed)
Drink lots of fluids, take all of medicine, use lozenges as needed.return if needed °

## 2014-09-03 NOTE — ED Provider Notes (Signed)
CSN: [id number]     Arrival date & time 09/03/14  1115 History   First MD Initiated Contact with Patient 09/03/14 1135     Chief Complaint  Patient presents with  . Sore Throat  . Headache   (Consider location/radiation/quality/duration/timing/severity/associated sxs/prior Treatment) Patient is a 53 y.o. female presenting with pharyngitis and headaches. The history is provided by the patient.  Sore Throat This is a new problem. The current episode started more than 2 days ago. The problem has been gradually worsening. Associated symptoms include headaches. The symptoms are aggravated by swallowing.  Headache Associated symptoms: cough, fever and sore throat     History reviewed. No pertinent past medical history. History reviewed. No pertinent past surgical history. No family history on file. History  Substance Use Topics  . Smoking status: Never Smoker   . Smokeless tobacco: Not on file  . Alcohol Use: No   OB History   Grav Para Term Preterm Abortions TAB SAB Ect Mult Living                 Review of Systems  Constitutional: Positive for fever.  HENT: Positive for sore throat. Negative for rhinorrhea.   Respiratory: Positive for cough.   Cardiovascular: Negative.   Neurological: Positive for headaches.    Allergies  Review of patient's allergies indicates no known allergies.  Home Medications   Prior to Admission medications   Medication Sig Start Date End Date Taking? Authorizing Provider  amoxicillin (AMOXIL) 500 MG capsule Take 1 capsule (500 mg total) by mouth 3 (three) times daily. 04/05/14   Reuben Likesavid C Keller, MD  cefdinir (OMNICEF) 300 MG capsule Take 1 capsule (300 mg total) by mouth 2 (two) times daily. 09/03/14   Linna HoffJames D Nikelle Malatesta, MD  predniSONE (DELTASONE) 20 MG tablet Take 1 tablet (20 mg total) by mouth 2 (two) times daily. 04/05/14   Reuben Likesavid C Keller, MD   BP 130/90  Pulse 119  Temp(Src) 102.2 F (39 C) (Oral)  Resp 16  SpO2 95% Physical Exam  Nursing note  and vitals reviewed. Constitutional: She is oriented to person, place, and time. She appears well-developed and well-nourished.  HENT:  Head: Normocephalic.  Right Ear: External ear normal.  Left Ear: External ear normal.  Mouth/Throat: Oropharyngeal exudate present.  Eyes: Pupils are equal, round, and reactive to light.  Neck: Normal range of motion. Neck supple.  Cardiovascular: Normal heart sounds.   Pulmonary/Chest: Effort normal and breath sounds normal.  Lymphadenopathy:    She has cervical adenopathy.  Neurological: She is alert and oriented to person, place, and time.  Skin: Skin is warm and dry.    ED Course  Procedures (including critical care time) Labs Review Labs Reviewed  POCT RAPID STREP A ([facility]) - Abnormal; Notable for the following:    Streptococcus, Group A Screen (Direct) POSITIVE (*)    All other components within normal limits    Imaging Review No results found.   MDM   1. Streptococcal sore throat       Linna HoffJames D Corynn Solberg, MD 09/03/14 1209

## 2014-09-03 NOTE — ED Notes (Signed)
Patient c/o sore throat and headache onset last night. Patient reports taking Robitussin with no relief. Patient denies fever. Patient is alert and oriented and in NAD.

## 2015-05-24 ENCOUNTER — Other Ambulatory Visit (HOSPITAL_COMMUNITY): Payer: Self-pay | Admitting: Obstetrics and Gynecology

## 2015-05-24 DIAGNOSIS — Z1231 Encounter for screening mammogram for malignant neoplasm of breast: Secondary | ICD-10-CM

## 2015-06-14 ENCOUNTER — Other Ambulatory Visit (HOSPITAL_COMMUNITY): Payer: Self-pay | Admitting: Obstetrics and Gynecology

## 2015-06-14 ENCOUNTER — Ambulatory Visit (HOSPITAL_COMMUNITY)
Admission: RE | Admit: 2015-06-14 | Discharge: 2015-06-14 | Disposition: A | Discharge status: Home / Self Care | Payer: BLUE CROSS/BLUE SHIELD | Source: Ambulatory Visit | Attending: Obstetrics and Gynecology | Admitting: Obstetrics and Gynecology

## 2015-06-14 DIAGNOSIS — Z1231 Encounter for screening mammogram for malignant neoplasm of breast: Secondary | ICD-10-CM | POA: Diagnosis not present

## 2015-06-16 ENCOUNTER — Other Ambulatory Visit: Payer: Self-pay | Admitting: Obstetrics and Gynecology

## 2015-06-16 DIAGNOSIS — R928 Other abnormal and inconclusive findings on diagnostic imaging of breast: Secondary | ICD-10-CM

## 2015-06-22 ENCOUNTER — Ambulatory Visit
Admission: RE | Admit: 2015-06-22 | Discharge: 2015-06-22 | Disposition: A | Discharge status: Home / Self Care | Payer: BLUE CROSS/BLUE SHIELD | Source: Ambulatory Visit | Attending: Obstetrics and Gynecology | Admitting: Obstetrics and Gynecology

## 2015-06-22 DIAGNOSIS — R928 Other abnormal and inconclusive findings on diagnostic imaging of breast: Secondary | ICD-10-CM

## 2015-10-07 IMAGING — MG MM SCREENING BREAST TOMO BILATERAL
6 of 9 series · 6 of 25 positions shown · non-contrast
Comparison: Previous exam(s).

CLINICAL DATA: Screening.

EXAM:
DIGITAL SCREENING BILATERAL MAMMOGRAM WITH 3D TOMO WITH CAD

[L MLO (1 of 2)]
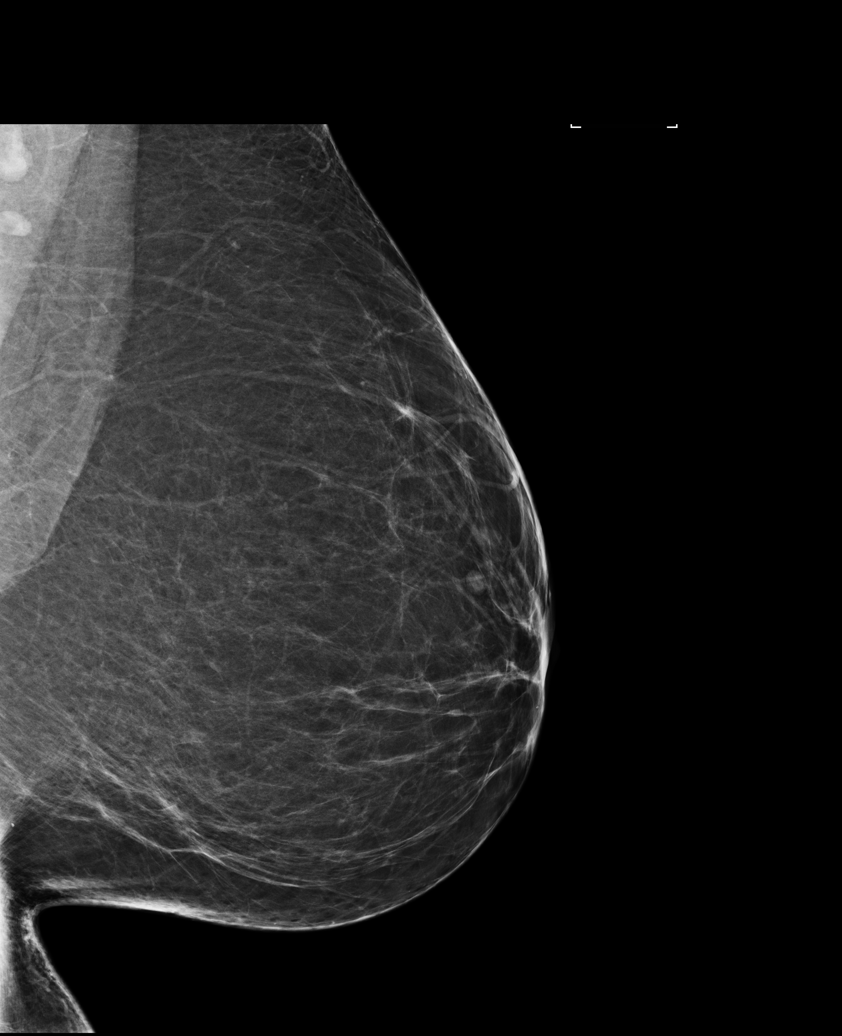

[R MLO]
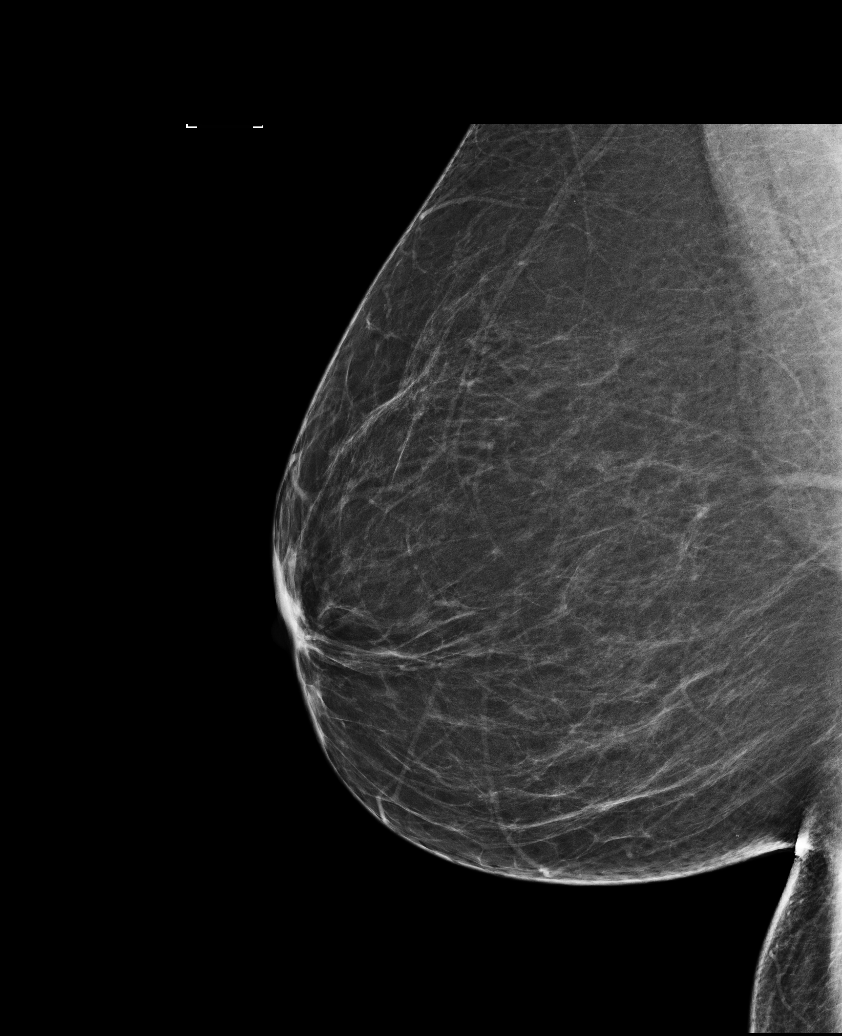

[L CC]
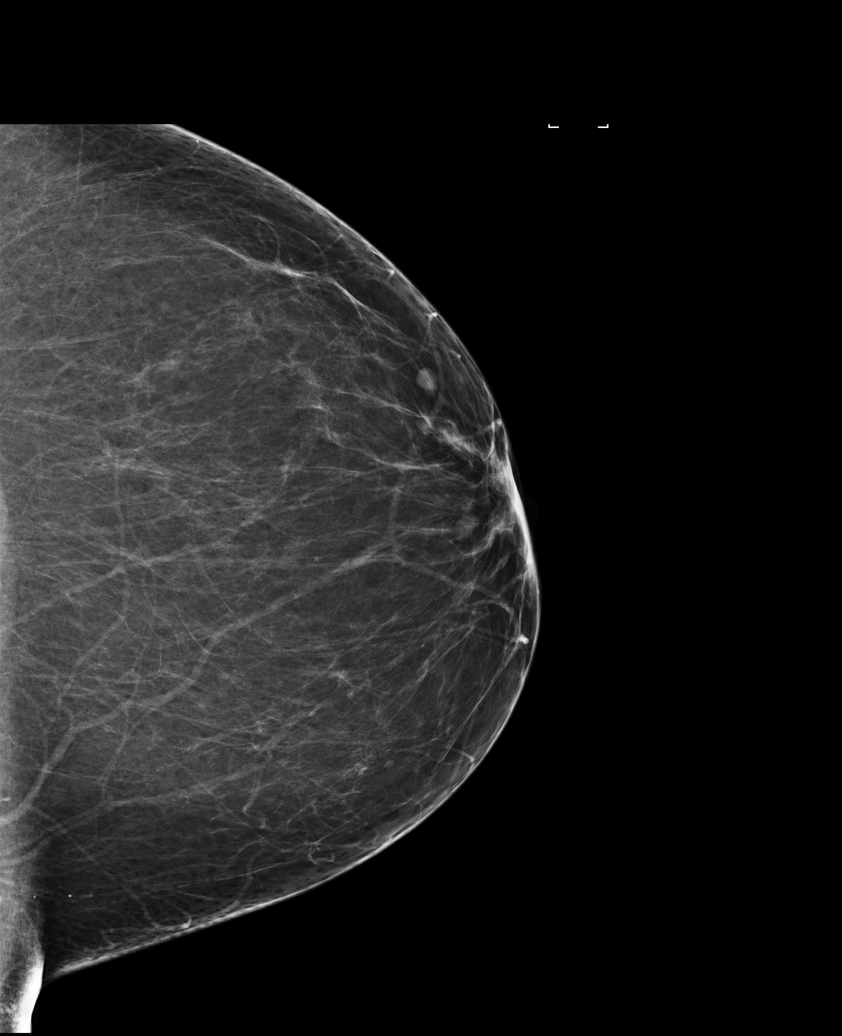

[R CC]
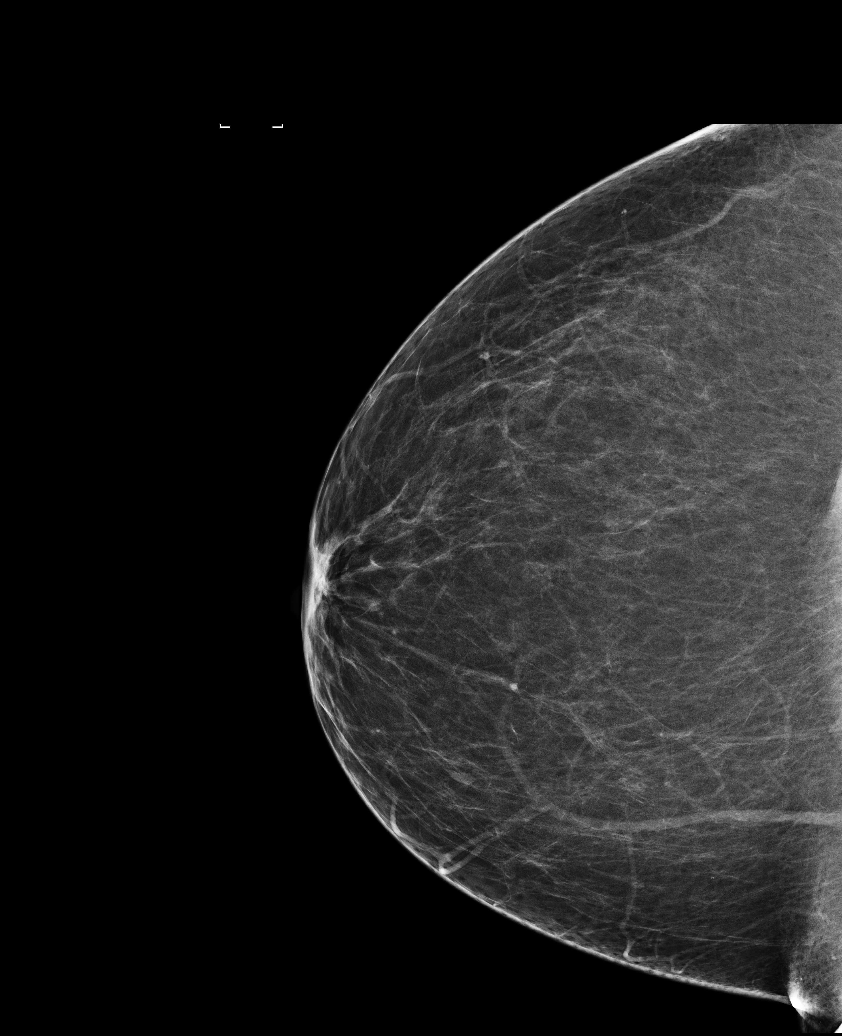

[L MLO (2 of 2)]
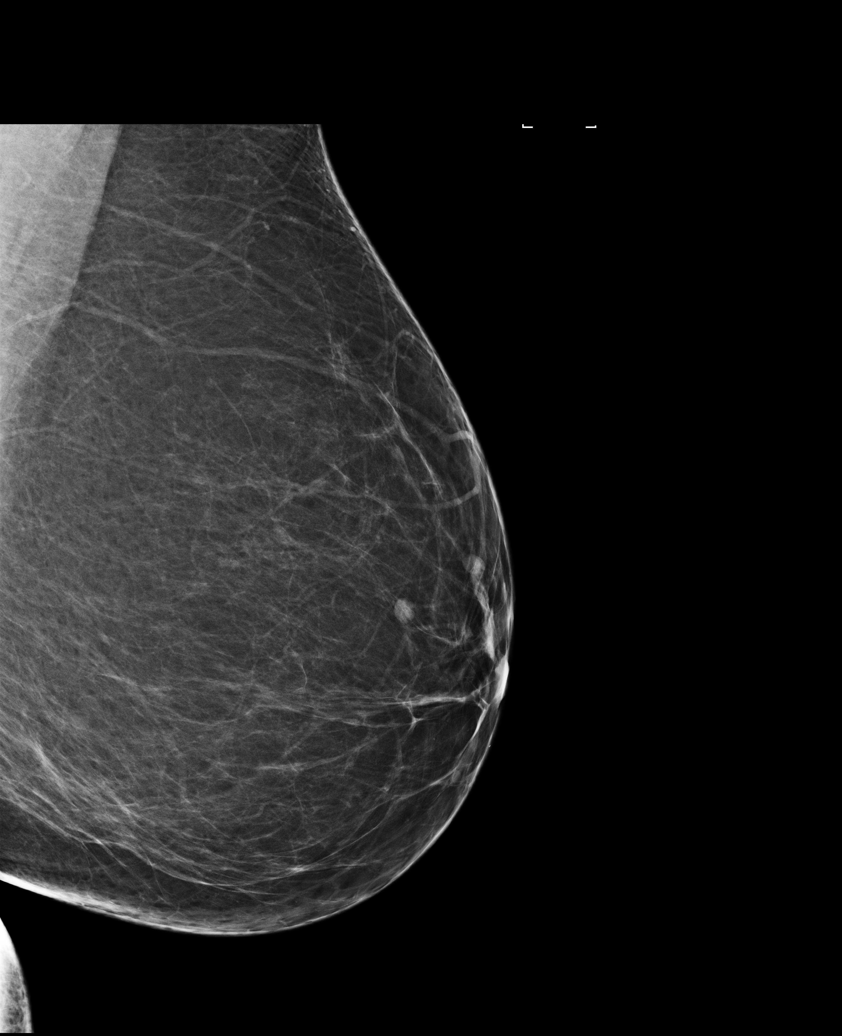

[L MLO tomo · tomo slice 49/97.0]
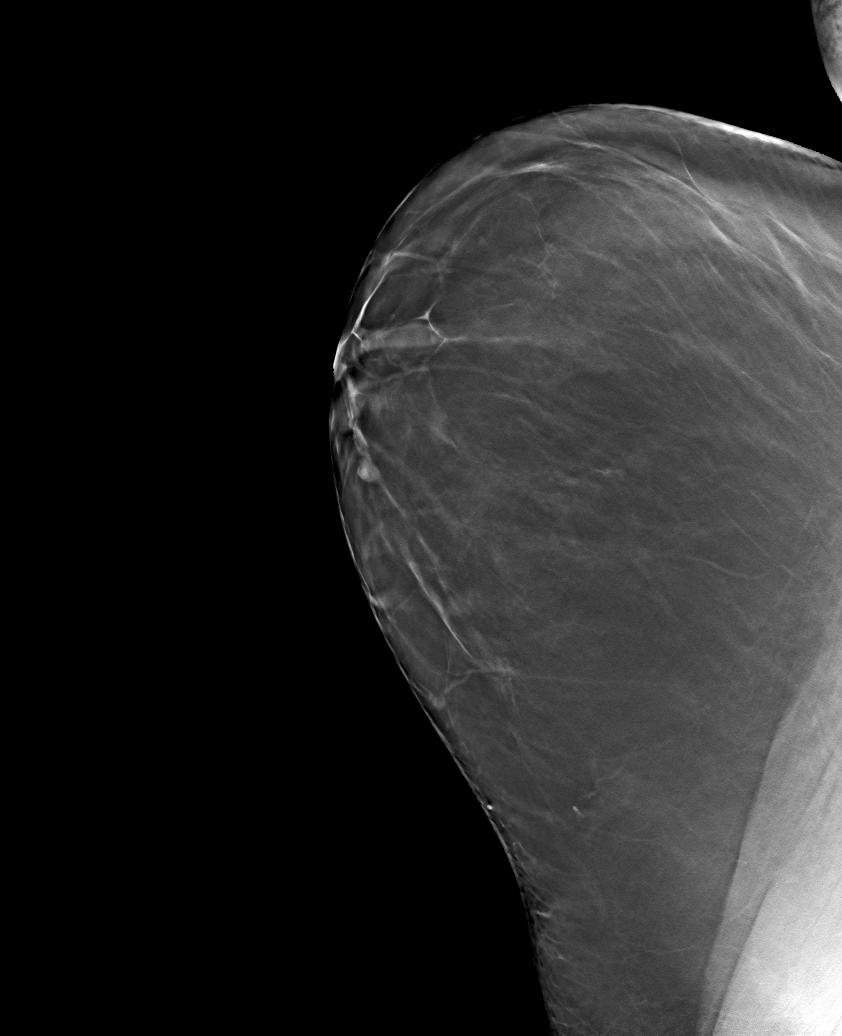

[6 of 25 positions shown; findings below may reference images not displayed]

ACR Breast Density Category b: There are scattered areas of
fibroglandular density.
FINDINGS: In the left breast, 2 possible masses warrant further evaluation. In
the right breast, no findings suspicious for malignancy.

Images were processed with CAD.
IMPRESSION: Further evaluation is suggested for possible masses in the left
breast.

RECOMMENDATION:
Ultrasound of the left breast. (Code:GL-[id number])

The patient will be contacted regarding the findings, and additional
imaging will be scheduled.

BI-RADS CATEGORY  0: Incomplete. Need additional imaging evaluation
and/or prior mammograms for comparison.

## 2016-04-04 ENCOUNTER — Other Ambulatory Visit: Payer: Self-pay

## 2016-04-04 DIAGNOSIS — Z1231 Encounter for screening mammogram for malignant neoplasm of breast: Secondary | ICD-10-CM

## 2016-07-11 ENCOUNTER — Other Ambulatory Visit: Payer: Self-pay | Admitting: Obstetrics and Gynecology

## 2016-07-11 DIAGNOSIS — Z1231 Encounter for screening mammogram for malignant neoplasm of breast: Secondary | ICD-10-CM

## 2016-08-16 ENCOUNTER — Ambulatory Visit: Payer: BLUE CROSS/BLUE SHIELD

## 2016-08-16 ENCOUNTER — Ambulatory Visit
Admission: RE | Admit: 2016-08-16 | Discharge: 2016-08-16 | Disposition: A | Discharge status: Home / Self Care | Payer: BLUE CROSS/BLUE SHIELD | Source: Ambulatory Visit | Attending: Obstetrics and Gynecology | Admitting: Obstetrics and Gynecology

## 2016-08-16 DIAGNOSIS — Z1231 Encounter for screening mammogram for malignant neoplasm of breast: Secondary | ICD-10-CM

## 2017-07-24 ENCOUNTER — Other Ambulatory Visit: Payer: Self-pay | Admitting: Obstetrics and Gynecology

## 2017-07-24 DIAGNOSIS — Z1231 Encounter for screening mammogram for malignant neoplasm of breast: Secondary | ICD-10-CM

## 2017-08-21 ENCOUNTER — Ambulatory Visit
Admission: RE | Admit: 2017-08-21 | Discharge: 2017-08-21 | Disposition: A | Discharge status: Home / Self Care | Payer: BLUE CROSS/BLUE SHIELD | Source: Ambulatory Visit | Attending: Obstetrics and Gynecology | Admitting: Obstetrics and Gynecology

## 2017-08-21 DIAGNOSIS — Z1231 Encounter for screening mammogram for malignant neoplasm of breast: Secondary | ICD-10-CM

## 2019-07-01 ENCOUNTER — Other Ambulatory Visit: Payer: Self-pay

## 2019-07-01 DIAGNOSIS — Z20822 Contact with and (suspected) exposure to covid-19: Secondary | ICD-10-CM

## 2019-07-03 LAB — NOVEL CORONAVIRUS, NAA: SARS-CoV-2, NAA: NOT DETECTED

## 2019-10-12 ENCOUNTER — Other Ambulatory Visit: Payer: Self-pay

## 2019-10-12 DIAGNOSIS — Z20822 Contact with and (suspected) exposure to covid-19: Secondary | ICD-10-CM

## 2019-10-13 LAB — NOVEL CORONAVIRUS, NAA: SARS-CoV-2, NAA: NOT DETECTED

## 2019-11-24 ENCOUNTER — Ambulatory Visit: Payer: BLUE CROSS/BLUE SHIELD | Attending: Internal Medicine

## 2019-11-24 DIAGNOSIS — Z20822 Contact with and (suspected) exposure to covid-19: Secondary | ICD-10-CM

## 2019-11-26 LAB — NOVEL CORONAVIRUS, NAA: SARS-CoV-2, NAA: NOT DETECTED

## 2019-12-02 ENCOUNTER — Ambulatory Visit: Payer: Self-pay | Admitting: *Deleted

## 2019-12-02 NOTE — Telephone Encounter (Addendum)
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 Patient does not have any symptoms but want to know if she should be tested again. She was informed of the CDC guidelines but still insist on speaking to a nurse. Can be reached at Ph# [phone]   Pt called back and information given to her regarding being retested. She is advised that she should not need to be retested. She denies having any symptoms, now or when she got tested the first time. Her client is doing well.  But she has another client that is 36 and she wanted to know if she should get tested before being with her. Advised her to speak with her provider. She voiced understanding.

## 2020-07-18 ENCOUNTER — Other Ambulatory Visit: Payer: Self-pay | Admitting: Family Medicine

## 2020-07-18 DIAGNOSIS — Z1231 Encounter for screening mammogram for malignant neoplasm of breast: Secondary | ICD-10-CM

## 2020-08-11 ENCOUNTER — Ambulatory Visit
Admission: RE | Admit: 2020-08-11 | Discharge: 2020-08-11 | Disposition: A | Discharge status: Home / Self Care | Payer: BLUE CROSS/BLUE SHIELD | Source: Ambulatory Visit | Attending: Family Medicine | Admitting: Family Medicine

## 2020-08-11 ENCOUNTER — Other Ambulatory Visit: Payer: Self-pay

## 2020-08-11 DIAGNOSIS — Z1231 Encounter for screening mammogram for malignant neoplasm of breast: Secondary | ICD-10-CM

## 2020-12-17 ENCOUNTER — Encounter (HOSPITAL_COMMUNITY): Payer: Self-pay

## 2020-12-17 ENCOUNTER — Other Ambulatory Visit: Payer: Self-pay

## 2020-12-17 ENCOUNTER — Ambulatory Visit (HOSPITAL_COMMUNITY)
Admission: EM | Admit: 2020-12-17 | Discharge: 2020-12-17 | Disposition: A | Discharge status: Home / Self Care | Payer: 59 | Attending: Family Medicine | Admitting: Family Medicine

## 2020-12-17 DIAGNOSIS — L0291 Cutaneous abscess, unspecified: Secondary | ICD-10-CM

## 2020-12-17 MED ORDER — DOXYCYCLINE HYCLATE 100 MG PO CAPS: 100.0000 mg | ORAL_CAPSULE | Freq: Two times a day (BID) | ORAL | 0 refills | Status: AC

## 2020-12-17 MED ORDER — FLUCONAZOLE 150 MG PO TABS: ORAL_TABLET | ORAL | 0 refills | Status: AC

## 2020-12-17 MED ORDER — HYDROCODONE-ACETAMINOPHEN 5-325 MG PO TABS: 1.0000 | ORAL_TABLET | Freq: Four times a day (QID) | ORAL | 0 refills | Status: AC | PRN

## 2020-12-17 NOTE — Discharge Instructions (Signed)

## 2020-12-17 NOTE — ED Triage Notes (Signed)
Patient states she has had a knot or bump on her RLQ x  1 week and describes it as a boil or knot. Pt states it is painful to the touch but is not draining. Pt is aox4 and ambulatory.

## 2020-12-19 NOTE — ED Provider Notes (Signed)
  [facility]   [id number] 12/17/20 Arrival Time: 1731  ASSESSMENT & PLAN:  1. Abscess     Incision and Drainage Procedure Note  Anesthesia: 2% plain lidocaine  Procedure Details  The procedure, risks and complications have been discussed in detail (including, but not limited to pain and bleeding) with the patient.  The skin induration was prepped and draped in the usual fashion. After adequate local anesthesia, I&D with a #11 blade was performed on the lower R abdominal wall at waistline with minimal purulent drainage.  EBL: minimal Drains: none Packing: n/a Condition: Tolerated procedure well Complications: none.   Meds ordered this encounter  Medications  . fluconazole (DIFLUCAN) 150 MG tablet    Sig: Take one tablet by mouth as a single dose. May repeat in 3 days if symptoms persist.    Dispense:  2 tablet    Refill:  0  . doxycycline (VIBRAMYCIN) 100 MG capsule    Sig: Take 1 capsule (100 mg total) by mouth 2 (two) times daily.    Dispense:  14 capsule    Refill:  0  . HYDROcodone-acetaminophen (NORCO/VICODIN) 5-325 MG tablet    Sig: Take 1 tablet by mouth every 6 (six) hours as needed for moderate pain or severe pain.    Dispense:  8 tablet    Refill:  0    To return in 48 hours for wound check if needed.  Finish all antibiotics. OTC analgesics as needed.  Reviewed expectations re: course of current medical issues. Questions answered. Outlined signs and symptoms indicating need for more acute intervention. Patient verbalized understanding. After Visit Summary given.   SUBJECTIVE:  Lacey Li is a 60 y.o. female who presents with a possible infection of her R abd at waistline; sev days; increasing erythema and pain. No drainage or bleeding. Afebrile.\    OBJECTIVE:  Vitals:   12/17/20 1808 12/17/20 1811  BP:  123/81  Pulse: 74   Resp: 18   Temp: 98.4 F (36.9 C)   TempSrc: Oral   SpO2: 99%      General appearance: alert; no  distress Abd wall: approx 0.5 x 1 cm induration; tender to touch; no active drainage or bleeding Psychological: alert and cooperative; normal mood and affect  No Known Allergies  History reviewed. No pertinent past medical history. Social History   Socioeconomic History  . Marital status: Single    Spouse name: Not on file  . Number of children: Not on file  . Years of education: Not on file  . Highest education level: Not on file  Occupational History  . Not on file  Tobacco Use  . Smoking status: Never Smoker  . Smokeless tobacco: Never Used  Vaping Use  . Vaping Use: Never used  Substance and Sexual Activity  . Alcohol use: No  . Drug use: No  . Sexual activity: Yes  Other Topics Concern  . Not on file  Social History Narrative  . Not on file   Social Determinants of Health   Financial Resource Strain: Not on file  Food Insecurity: Not on file  Transportation Needs: Not on file  Physical Activity: Not on file  Stress: Not on file  Social Connections: Not on file   Family History  Problem Relation Age of Onset  . Breast cancer Sister 71   History reviewed. No pertinent surgical history.         Mardella Layman, MD 12/19/20 1216

## 2021-08-23 ENCOUNTER — Other Ambulatory Visit: Payer: Self-pay | Admitting: Family Medicine

## 2021-08-23 DIAGNOSIS — Z1231 Encounter for screening mammogram for malignant neoplasm of breast: Secondary | ICD-10-CM

## 2021-09-08 ENCOUNTER — Ambulatory Visit: Payer: 59

## 2021-09-26 ENCOUNTER — Ambulatory Visit: Payer: 59

## 2021-11-07 ENCOUNTER — Ambulatory Visit (INDEPENDENT_AMBULATORY_CARE_PROVIDER_SITE_OTHER): Payer: 59

## 2021-11-07 ENCOUNTER — Other Ambulatory Visit: Payer: Self-pay

## 2021-11-07 ENCOUNTER — Ambulatory Visit (INDEPENDENT_AMBULATORY_CARE_PROVIDER_SITE_OTHER): Payer: 59 | Admitting: Orthopedic Surgery

## 2021-11-07 DIAGNOSIS — M79644 Pain in right finger(s): Secondary | ICD-10-CM

## 2021-11-07 NOTE — Progress Notes (Signed)
Office Visit Note   Patient: Lacey Li           Date of Birth: 1961/10/20           MRN: [id number] Visit Date: 11/07/2021              Requested by: No referring provider defined for this encounter. PCP: Pcp, No   Assessment & Plan: Visit Diagnoses:  1. Pain in right finger(s)     Plan: Discussed with patient that she likely injured the radial collateral ligament given the mild erythema at the radial side of the PIP joint.  Discussed the nature of PIP joint injuries including prolonged swelling and increased risk of stiffness.  She has good ROM and only lacks approximately 5 degrees from complete extension.  Discussed strategies to help reduce swelling and improve ROM.  Looked at the ring finger PIP joint under live fluoroscopy and joint was stable with smooth gliding motion.  She can follow up with me again as needed.   Follow-Up Instructions: No follow-ups on file.   Orders:  Orders Placed This Encounter  Procedures   XR Finger Ring Right   No orders of the defined types were placed in this encounter.     Procedures: No procedures performed   Clinical Data: No additional findings.   Subjective: Chief Complaint  Patient presents with   Other     Right ring finger injury from September 2022    This is a 60 year old right-hand-dominant female who presents for follow-up of a right ring finger PIP joint injury.  He initial injury was 08/03/2021 in which she caught herself with her outstretched right hand.  Since the injury she is noted swelling of this ring finger PIP joint with tightness with range of motion.  She has been seen by hand therapist previously who worked with her on range of motion, splinting, edema control.  She has minimal tenderness to palpation of the finger.  She has been using Covan at night for edema control.   Review of Systems   Objective: Vital Signs: There were no vitals taken for this visit.  Physical Exam Constitutional:       Appearance: Normal appearance.  Cardiovascular:     Rate and Rhythm: Normal rate.     Pulses: Normal pulses.  Pulmonary:     Effort: Pulmonary effort is normal.  Skin:    General: Skin is warm and dry.     Capillary Refill: Capillary refill takes less than 2 seconds.  Neurological:     Mental Status: She is alert.    Right Hand Exam   Tenderness  Right hand tenderness location: Minimal TTP at radial aspect of finger finger PIP joint.  Other  Erythema: absent Sensation: normal Pulse: present  Comments:  Full flexion of ring finger PIP joint but lacks approximately 5 deg from full extension.  No pain or instability with radial/ulnar stress at PIP joint.      Specialty Comments:  No specialty comments available.  Imaging: 3 views of the right ring finger taken today reviewed interpreted by me.  They demonstrate concentric joint with symmetric and well-maintained joint space.  There is no evidence of subluxation or instability of the joint. The joint was examined under live fluoroscopy which demonstrated a smooth gliding arc of motion with no evidence of instability.  PMFS History: There are no problems to display for this patient.  No past medical history on file.  Family History  Problem Relation Age  of Onset   Breast cancer Sister 34    No past surgical history on file. Social History   Occupational History   Not on file  Tobacco Use   Smoking status: Never   Smokeless tobacco: Never  Vaping Use   Vaping Use: Never used  Substance and Sexual Activity   Alcohol use: No   Drug use: No   Sexual activity: Yes

## 2021-12-08 ENCOUNTER — Ambulatory Visit (HOSPITAL_COMMUNITY)
Admission: EM | Admit: 2021-12-08 | Discharge: 2021-12-08 | Disposition: A | Discharge status: Home / Self Care | Payer: Self-pay | Attending: Emergency Medicine | Admitting: Emergency Medicine

## 2021-12-08 ENCOUNTER — Other Ambulatory Visit: Payer: Self-pay

## 2021-12-08 ENCOUNTER — Encounter (HOSPITAL_COMMUNITY): Payer: Self-pay

## 2021-12-08 DIAGNOSIS — B349 Viral infection, unspecified: Secondary | ICD-10-CM | POA: Insufficient documentation

## 2021-12-08 DIAGNOSIS — Z20822 Contact with and (suspected) exposure to covid-19: Secondary | ICD-10-CM | POA: Insufficient documentation

## 2021-12-08 NOTE — ED Provider Notes (Signed)
[facility]    CSN: [id number] Arrival date & time: 12/08/21  1400      History   Chief Complaint Chief Complaint  Patient presents with   Cough   Fever    HPI ALEK PONCEDELEON is a 61 y.o. female.   Presents with fevers, nasal congestion, rhinorrhea, nonproductive cough, increased shortness of breath and intermittent generalized headaches for 1 day.  Known exposure for COVID at work.  Has attempted use of Benadryl which was somewhat helpful.  No pertinent medical history.    History reviewed. No pertinent past medical history.  There are no problems to display for this patient.   History reviewed. No pertinent surgical history.  OB History   No obstetric history on file.      Home Medications    Prior to Admission medications   Medication Sig Start Date End Date Taking? Authorizing Provider  doxycycline (VIBRAMYCIN) 100 MG capsule Take 1 capsule (100 mg total) by mouth 2 (two) times daily. 12/17/20   Mardella Layman, MD  fluconazole (DIFLUCAN) 150 MG tablet Take one tablet by mouth as a single dose. May repeat in 3 days if symptoms persist. 12/17/20   Mardella Layman, MD  HYDROcodone-acetaminophen (NORCO/VICODIN) 5-325 MG tablet Take 1 tablet by mouth every 6 (six) hours as needed for moderate pain or severe pain. 12/17/20   Mardella Layman, MD    Family History Family History  Problem Relation Age of Onset   Breast cancer Sister 47    Social History Social History   Tobacco Use   Smoking status: Never   Smokeless tobacco: Never  Vaping Use   Vaping Use: Never used  Substance Use Topics   Alcohol use: No   Drug use: No     Allergies   Patient has no known allergies.   Review of Systems Review of Systems  Constitutional:  Positive for fever. Negative for activity change, appetite change, chills, diaphoresis, fatigue and unexpected weight change.  HENT:  Positive for congestion and rhinorrhea. Negative for dental problem, drooling, ear discharge, ear  pain, facial swelling, hearing loss, mouth sores, nosebleeds, postnasal drip, sinus pressure, sinus pain, sneezing, sore throat, tinnitus, trouble swallowing and voice change.   Respiratory:  Positive for cough and shortness of breath. Negative for apnea, choking, chest tightness, wheezing and stridor.   Cardiovascular: Negative.   Gastrointestinal: Negative.   Skin: Negative.   Neurological:  Positive for headaches. Negative for dizziness, tremors, seizures, syncope, facial asymmetry, speech difficulty, weakness, light-headedness and numbness.    Physical Exam Triage Vital Signs ED Triage Vitals [12/08/21 1545]  Enc Vitals Group     BP 125/68     Pulse Rate 98     Resp 16     Temp 98 F (36.7 C)     Temp Source Oral     SpO2 100 %     Weight      Height      Head Circumference      Peak Flow      Pain Score 3     Pain Loc      Pain Edu?      Excl. in GC?    No data found.  Updated Vital Signs BP 125/68 (BP Location: Left Arm)    Pulse 98    Temp 98 F (36.7 C) (Oral)    Resp 16    SpO2 100%   Visual Acuity Right Eye Distance:   Left Eye Distance:   Bilateral  Distance:    Right Eye Near:   Left Eye Near:    Bilateral Near:     Physical Exam Constitutional:      Appearance: Normal appearance. She is normal weight.  HENT:     Head: Normocephalic.     Right Ear: Tympanic membrane, ear canal and external ear normal.     Left Ear: Tympanic membrane, ear canal and external ear normal.     Nose: Congestion and rhinorrhea present.     Mouth/Throat:     Mouth: Mucous membranes are moist.     Pharynx: Posterior oropharyngeal erythema present.  Eyes:     Extraocular Movements: Extraocular movements intact.  Cardiovascular:     Rate and Rhythm: Normal rate and regular rhythm.     Pulses: Normal pulses.     Heart sounds: Normal heart sounds.  Pulmonary:     Effort: Pulmonary effort is normal.     Breath sounds: Normal breath sounds.  Musculoskeletal:     Cervical  back: Normal range of motion and neck supple.  Skin:    General: Skin is warm and dry.  Neurological:     Mental Status: She is alert and oriented to person, place, and time. Mental status is at baseline.  Psychiatric:        Mood and Affect: Mood normal.        Behavior: Behavior normal.     UC Treatments / Results  Labs (all labs ordered are listed, but only abnormal results are displayed) Labs Reviewed - No data to display  EKG   Radiology No results found.  Procedures Procedures (including critical care time)  Medications Ordered in UC Medications - No data to display  Initial Impression / Assessment and Plan / UC Course  I have reviewed the triage vital signs and the nursing notes.  Pertinent labs & imaging results that were available during my care of the patient were reviewed by me and considered in my medical decision making (see chart for details).  Illness Close exposure to COVID-19 virus  COVID test pending, patient is not eligible for antiviral treatment, discussed with patient, verbalized understanding,, will move forward with over-the-counter medications for symptom management, urgent care follow-up as needed Final Clinical Impressions(s) / UC Diagnoses   Final diagnoses:  None   Discharge Instructions   None    ED Prescriptions   None    PDMP not reviewed this encounter.   Valinda Hoar, NP 12/11/21 1330

## 2021-12-08 NOTE — Discharge Instructions (Addendum)
Your symptoms today are most likely being caused by a virus and should steadily improve in time it can take up to 7 to 10 days before you truly start to see a turnaround however things will get better  Covid test pending, you will be called if positive, please quarantine for for 5 days, you may return to activity on Tuesday     You can take Tylenol and/or Ibuprofen as needed for fever reduction and pain relief.   For cough: honey 1/2 to 1 teaspoon (you can dilute the honey in water or another fluid).  You can also use guaifenesin and dextromethorphan for cough. You can use a humidifier for chest congestion and cough.  If you don't have a humidifier, you can sit in the bathroom with the hot shower running.      For sore throat: try warm salt water gargles, cepacol lozenges, throat spray, warm tea or water with lemon/honey, popsicles or ice, or OTC cold relief medicine for throat discomfort.   For congestion: take a daily anti-histamine like Zyrtec, Claritin, and a oral decongestant, such as pseudoephedrine.  You can also use Flonase 1-2 sprays in each nostril daily.   It is important to stay hydrated: drink plenty of fluids (water, gatorade/powerade/pedialyte, juices, or teas) to keep your throat moisturized and help further relieve irritation/discomfort.

## 2021-12-08 NOTE — ED Triage Notes (Signed)
Pt presents to urgent care for coughing,congestion and fever x 2 days.

## 2021-12-09 LAB — SARS CORONAVIRUS 2 (TAT 6-24 HRS): SARS Coronavirus 2: POSITIVE — AB

## 2023-07-31 ENCOUNTER — Encounter: Payer: Self-pay | Admitting: Podiatry

## 2023-07-31 ENCOUNTER — Ambulatory Visit: Payer: Commercial Managed Care - HMO | Admitting: Podiatry

## 2023-07-31 DIAGNOSIS — L600 Ingrowing nail: Secondary | ICD-10-CM

## 2023-08-06 NOTE — Progress Notes (Signed)
   Chief Complaint  Patient presents with   Ingrown Toenail    RM6: patient is here for ingrown toenail on right foot    Subjective: Patient presents today for evaluation of pain to the right great toe. Patient is concerned for possible ingrown nail.  It is very sensitive to touch.  Patient presents today for further treatment and evaluation.  History reviewed. No pertinent past medical history.  History reviewed. No pertinent surgical history.  No Known Allergies  Objective:  General: Well developed, nourished, in no acute distress, alert and oriented x3   Dermatology: Skin is warm, dry and supple bilateral. Right great toe is tender with evidence of an ingrowing nail. Pain on palpation noted to the border of the nail fold. The remaining nails appear unremarkable at this time.   Vascular: DP and PT pulses palpable.  No clinical evidence of vascular compromise  Neruologic: Grossly intact via light touch bilateral.  Musculoskeletal: No pedal deformity noted  Assesement: #1 Paronychia with ingrowing nail right great toe  Plan of Care:  -Patient evaluated.  -Discussed treatment alternatives and plan of care. Explained nail avulsion procedure and post procedure course to patient. -Patient opted for permanent partial nail avulsion of the ingrown portion of the nail.  -Prior to procedure, local anesthesia infiltration utilized using 3 ml of a 50:50 mixture of 2% plain lidocaine and 0.5% plain marcaine in a normal hallux block fashion and a betadine prep performed.  -Partial permanent nail avulsion with chemical matrixectomy performed using 3x30sec applications of phenol followed by alcohol flush.  -Light dressing applied.  Post care instructions provided -Return to clinic 3 weeks  Felecia Shelling, DPM [facility]  Dr. Felecia Shelling, DPM    [address] [place], [place] [address]                Office [phone]  Fax  [phone]

## 2024-12-06 ENCOUNTER — Other Ambulatory Visit (HOSPITAL_BASED_OUTPATIENT_CLINIC_OR_DEPARTMENT_OTHER): Payer: Self-pay | Admitting: Obstetrics & Gynecology

## 2024-12-06 DIAGNOSIS — N951 Menopausal and female climacteric states: Secondary | ICD-10-CM
